# Patient Record
Sex: Male | Born: 1995 | Race: White | Hispanic: Yes | Marital: Married | Smoking: Never smoker
Health system: Southern US, Community
[De-identification: ages and names within clinical notes are randomized; demographics above are authoritative.]

## PROBLEM LIST (undated history)

## (undated) DIAGNOSIS — R41840 Attention and concentration deficit: Secondary | ICD-10-CM

## (undated) DIAGNOSIS — J45909 Unspecified asthma, uncomplicated: Secondary | ICD-10-CM

## (undated) HISTORY — PX: CYSTOSCOPY: SUR368

---
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
## 2013-01-04 ENCOUNTER — Emergency Department (HOSPITAL_COMMUNITY)
Admission: EM | Admit: 2013-01-04 | Discharge: 2013-01-04 | Disposition: A | Discharge status: Home / Self Care | Payer: Medicaid Other | Attending: Emergency Medicine | Admitting: Emergency Medicine

## 2013-01-04 ENCOUNTER — Encounter (HOSPITAL_COMMUNITY): Payer: Self-pay

## 2013-01-04 ENCOUNTER — Emergency Department (HOSPITAL_COMMUNITY): Payer: Medicaid Other

## 2013-01-04 DIAGNOSIS — M546 Pain in thoracic spine: Secondary | ICD-10-CM | POA: Insufficient documentation

## 2013-01-04 DIAGNOSIS — F988 Other specified behavioral and emotional disorders with onset usually occurring in childhood and adolescence: Secondary | ICD-10-CM | POA: Insufficient documentation

## 2013-01-04 LAB — URINALYSIS, ROUTINE W REFLEX MICROSCOPIC
Glucose, UA: NEGATIVE mg/dL
Ketones, ur: NEGATIVE mg/dL
Leukocytes, UA: NEGATIVE
Nitrite: NEGATIVE
Protein, ur: NEGATIVE mg/dL
Urobilinogen, UA: 1 mg/dL (ref 0.0–1.0)

## 2013-01-04 MED ORDER — CYCLOBENZAPRINE HCL 10 MG PO TABS: 10.0000 mg | ORAL_TABLET | Freq: Two times a day (BID) | ORAL | Status: DC | PRN

## 2013-01-04 MED ORDER — NAPROXEN 500 MG PO TABS: 500.0000 mg | ORAL_TABLET | Freq: Two times a day (BID) | ORAL | Status: DC

## 2013-01-04 NOTE — ED Provider Notes (Signed)
Medical screening examination/treatment/procedure(s) were performed by non-physician practitioner and as supervising physician I was immediately available for consultation/collaboration.  Gilda Crease, MD 01/04/13 [phone]

## 2013-01-04 NOTE — ED Provider Notes (Signed)
History     CSN: [id number]  Arrival date & time 01/04/13  1211   First MD Initiated Contact with Patient 01/04/13 1440      Chief Complaint  Patient presents with  . Back Pain    (Consider location/radiation/quality/duration/timing/severity/associated sxs/prior treatment) HPI Carlos Cruz is a 17 y.o. male who presents to ED with complaint of mid back pain. States started about 2 days ago. No injury. No fever, chills, abdominal pain, dysuria, hematuria. Pain radiates up and down the back. Pt has tried ibuprofen, icy hot, and OTC "pain reliever" with no improvement. Pain worsened with movement of the torso and with taking deep breath. Pt admits to having "a lot of" intercourse recently and states his wife is pregnant, and has had to use different positions and wondering if that could cause his pain. NO numbness or weakness of extremities.    Past Medical History  Diagnosis Date  . Attention deficit     Past Surgical History  Procedure Laterality Date  . Cystoscopy      History reviewed. No pertinent family history.  History  Substance Use Topics  . Smoking status: Never Smoker   . Smokeless tobacco: Never Used  . Alcohol Use: Yes     Comment: rarely      Review of Systems  Constitutional: Negative for fever and chills.  HENT: Negative for neck pain and neck stiffness.   Respiratory: Negative.   Cardiovascular: Negative.   Gastrointestinal: Negative for nausea, vomiting and abdominal pain.  Musculoskeletal: Positive for back pain.  Skin: Negative.   Neurological: Negative for weakness and numbness.    Allergies  Ciprofloxacin  Home Medications   Current Outpatient Rx  Name  Route  Sig  Dispense  Refill  . acetaminophen (TYLENOL) 500 MG tablet   Oral   Take 500 mg by mouth every 6 (six) hours as needed for pain.         Marland Kitchen ibuprofen (ADVIL,MOTRIN) 200 MG tablet   Oral   Take 400 mg by mouth every 8 (eight) hours as needed for pain.         .  cyclobenzaprine (FLEXERIL) 10 MG tablet   Oral   Take 1 tablet (10 mg total) by mouth 2 (two) times daily as needed for muscle spasms.   20 tablet   0   . naproxen (NAPROSYN) 500 MG tablet   Oral   Take 1 tablet (500 mg total) by mouth 2 (two) times daily.   30 tablet   0     BP 114/67  Pulse 68  Temp(Src) 98.4 F (36.9 C) (Oral)  Resp 20  SpO2 94%  Physical Exam  Nursing note and vitals reviewed. Constitutional: He is oriented to person, place, and time. He appears well-developed and well-nourished. No distress.  Eyes: Conjunctivae are normal.  Neck: Neck supple.  Cardiovascular: Normal rate, regular rhythm and normal heart sounds.   Pulmonary/Chest: Effort normal and breath sounds normal. No respiratory distress. He has no wheezes. He has no rales.  Musculoskeletal:  Tenderness to the mid left and right back. CVA tenderness. No midline thoracic or lumbar spine tenderness. No pain with bilateral straight leg raise  Neurological: He is alert and oriented to person, place, and time.  Skin: Skin is warm and dry.    ED Course  Procedures (including critical care time)  Results for orders placed during the hospital encounter of 01/04/13  URINALYSIS, ROUTINE W REFLEX MICROSCOPIC      Result Value Range  Color, Urine YELLOW  YELLOW   APPearance CLEAR  CLEAR   Specific Gravity, Urine 1.020  1.005 - 1.030   pH 8.0  5.0 - 8.0   Glucose, UA NEGATIVE  NEGATIVE mg/dL   Hgb urine dipstick NEGATIVE  NEGATIVE   Bilirubin Urine NEGATIVE  NEGATIVE   Ketones, ur NEGATIVE  NEGATIVE mg/dL   Protein, ur NEGATIVE  NEGATIVE mg/dL   Urobilinogen, UA 1.0  0.0 - 1.0 mg/dL   Nitrite NEGATIVE  NEGATIVE   Leukocytes, UA NEGATIVE  NEGATIVE   Dg Chest 2 View  01/04/2013  *RADIOLOGY REPORT*  Clinical Data: Back pain.  Chest pain and shortness of breath.  CHEST - 2 VIEW  Comparison: 11/05/2012  Findings: The heart size and mediastinal contours are within normal limits.  Both lungs are clear.   The visualized skeletal structures are unremarkable.  IMPRESSION: Negative exam.   Original Report Authenticated By: Signa Kell, M.D.       1. Back pain       MDM  Pt with mid upper back pain. No injury. Negative UA. Negative CXR. PERC negative. Will treat with muscle relaxant, NSAIDs, suspect musculoskeletal. Follow up with PCP.   Filed Vitals:   01/04/13 1243  BP: 114/67  Pulse: 68  Temp: 98.4 F (36.9 C)  Resp: [address] A Addaline Peplinski, PA 01/04/13 1531

## 2013-01-04 NOTE — ED Notes (Signed)
Patient reports that his mid back began hurting yesterday. Patient's mother states that she gave the patient Ibuprofen, Icy hot, and OTC "pain reliever", and patient had a hot shower with no relief.

## 2013-02-10 ENCOUNTER — Encounter (HOSPITAL_COMMUNITY): Payer: Self-pay | Admitting: *Deleted

## 2013-02-10 ENCOUNTER — Emergency Department (HOSPITAL_COMMUNITY)
Admission: EM | Admit: 2013-02-10 | Discharge: 2013-02-10 | Disposition: A | Discharge status: Home / Self Care | Payer: Medicaid Other | Attending: Emergency Medicine | Admitting: Emergency Medicine

## 2013-02-10 DIAGNOSIS — Y93G1 Activity, food preparation and clean up: Secondary | ICD-10-CM | POA: Insufficient documentation

## 2013-02-10 DIAGNOSIS — Z8659 Personal history of other mental and behavioral disorders: Secondary | ICD-10-CM | POA: Insufficient documentation

## 2013-02-10 DIAGNOSIS — W261XXA Contact with sword or dagger, initial encounter: Secondary | ICD-10-CM | POA: Insufficient documentation

## 2013-02-10 DIAGNOSIS — Z23 Encounter for immunization: Secondary | ICD-10-CM | POA: Insufficient documentation

## 2013-02-10 DIAGNOSIS — Y929 Unspecified place or not applicable: Secondary | ICD-10-CM | POA: Insufficient documentation

## 2013-02-10 DIAGNOSIS — S61209A Unspecified open wound of unspecified finger without damage to nail, initial encounter: Secondary | ICD-10-CM | POA: Insufficient documentation

## 2013-02-10 DIAGNOSIS — S61219A Laceration without foreign body of unspecified finger without damage to nail, initial encounter: Secondary | ICD-10-CM

## 2013-02-10 DIAGNOSIS — W260XXA Contact with knife, initial encounter: Secondary | ICD-10-CM | POA: Insufficient documentation

## 2013-02-10 MED ORDER — TETANUS-DIPHTH-ACELL PERTUSSIS 5-2.5-18.5 LF-MCG/0.5 IM SUSP
0.5000 mL | Freq: Once | INTRAMUSCULAR | Status: AC
  Administered 2013-02-10: 0.5 mL via INTRAMUSCULAR
  Filled 2013-02-10: qty 0.5

## 2013-02-10 NOTE — ED Notes (Signed)
ZOX:[id number]<AV> Expected date:02/10/13<BR> Expected time:11:26 AM<BR> Means of arrival:Ambulance<BR> Comments:<BR> Cut tip of finger while cooking

## 2013-02-10 NOTE — ED Notes (Signed)
Per EMS - Pt slicing carrots for stew sustained laceration to left long finger w/ knife about 1 hour ago. Pt is holding pressure w/ 8x10. BP - 122/88, HR - 64, Resp - 16.

## 2013-02-10 NOTE — ED Provider Notes (Signed)
History     CSN: [id number]  Arrival date & time 02/10/13  1130   First MD Initiated Contact with Patient 02/10/13 1145      Chief Complaint  Patient presents with  . Extremity Laceration    (Consider location/radiation/quality/duration/timing/severity/associated sxs/prior treatment) HPI Comments: Patient presents with a small shallow finger laceration to the left middle finger. Bleeding is controlled. States that he cut his finger while cutting carrots. This happened approximately one hour ago. He states his pain is moderate. He has not tried anything to alleviate his symptoms. Nothing makes his symptoms better or worse. He does not remember when his last tetanus shot was. He is still able to move his finger without any difficulty.  The history is provided by the patient. No language interpreter was used.    Past Medical History  Diagnosis Date  . Attention deficit     Past Surgical History  Procedure Laterality Date  . Cystoscopy      History reviewed. No pertinent family history.  History  Substance Use Topics  . Smoking status: Never Smoker   . Smokeless tobacco: Never Used  . Alcohol Use: Yes     Comment: rarely      Review of Systems  All other systems reviewed and are negative.    Allergies  Ciprofloxacin  Home Medications  No current outpatient prescriptions on file.  BP 120/73  Pulse 63  Temp(Src) 97.7 F (36.5 C) (Oral)  Resp 16  SpO2 100%  Physical Exam  Nursing note and vitals reviewed. Constitutional: He is oriented to person, place, and time. He appears well-developed and well-nourished.  HENT:  Head: Normocephalic and atraumatic.  Eyes: Conjunctivae and EOM are normal.  Neck: Normal range of motion.  Cardiovascular: Normal rate.   Pulmonary/Chest: Effort normal.  Abdominal: He exhibits no distension.  Musculoskeletal: Normal range of motion.  Neurological: He is alert and oriented to person, place, and time.  Skin: Skin is dry.   Small, shallow, 0.5 cm avulsion of distal left middle finger, bleeding is controlled, no tendon, oh, or ligament involvement. Range of motion and strength 5 out of 5  Psychiatric: He has a normal mood and affect. His behavior is normal. Judgment and thought content normal.    ED Course  Procedures (including critical care time)  Labs Reviewed - No data to display No results found.   1. Finger laceration, initial encounter       MDM  Small shallow 0.5 cm finger avulsion.  No repair required.  Tetanus updated.  Wound cleaned and dressed.        Roxy Horseman, PA-C 02/10/13 1534

## 2013-02-10 NOTE — ED Provider Notes (Signed)
Medical screening examination/treatment/procedure(s) were performed by non-physician practitioner and as supervising physician I was immediately available for consultation/collaboration.   Glynn Octave, MD 02/10/13 [phone]

## 2013-04-30 ENCOUNTER — Emergency Department (HOSPITAL_COMMUNITY)
Admission: EM | Admit: 2013-04-30 | Discharge: 2013-05-01 | Disposition: A | Discharge status: Home / Self Care | Payer: Medicaid Other | Attending: Emergency Medicine | Admitting: Emergency Medicine

## 2013-04-30 ENCOUNTER — Encounter (HOSPITAL_COMMUNITY): Payer: Self-pay | Admitting: *Deleted

## 2013-04-30 DIAGNOSIS — Z8659 Personal history of other mental and behavioral disorders: Secondary | ICD-10-CM | POA: Insufficient documentation

## 2013-04-30 DIAGNOSIS — L02416 Cutaneous abscess of left lower limb: Secondary | ICD-10-CM

## 2013-04-30 DIAGNOSIS — L02419 Cutaneous abscess of limb, unspecified: Secondary | ICD-10-CM | POA: Insufficient documentation

## 2013-04-30 NOTE — ED Notes (Signed)
Pt states that he has had a boil on his leg x 7 days; denies drainge

## 2013-05-01 MED ORDER — LIDOCAINE HCL 2 % IJ SOLN
INTRAMUSCULAR | Status: AC
  Filled 2013-05-01: qty 20

## 2013-05-01 MED ORDER — CLINDAMYCIN HCL 150 MG PO CAPS: 300.0000 mg | ORAL_CAPSULE | Freq: Three times a day (TID) | ORAL | Status: DC

## 2013-05-01 NOTE — ED Provider Notes (Signed)
   History    CSN: [id number] Arrival date & time 04/30/13  2235  First MD Initiated Contact with Patient 04/30/13 2351     Chief Complaint  Patient presents with  . Abscess   (Consider location/radiation/quality/duration/timing/severity/associated sxs/prior Treatment) HPI Carlos Cruz is a 17 y.o. male who presents to ED with complaint of an abscess to the left high. States started a week ago as a pimple. Gradually gotten larger. Now very tender, red. No drainage. Tried compresses with no improvement. Did not take any medications for this.   Past Medical History  Diagnosis Date  . Attention deficit    Past Surgical History  Procedure Laterality Date  . Cystoscopy     No family history on file. History  Substance Use Topics  . Smoking status: Never Smoker   . Smokeless tobacco: Never Used  . Alcohol Use: Yes     Comment: rarely    Review of Systems  Constitutional: Negative for fever and chills.  Respiratory: Negative.   Cardiovascular: Negative.   Skin: Positive for wound.    Allergies  Ciprofloxacin  Home Medications  No current outpatient prescriptions on file. BP 120/52  Pulse 68  Temp(Src) 97.9 F (36.6 C)  Resp 18  Ht 5\' 5"  (1.651 m)  Wt 140 lb 3.2 oz (63.594 kg)  BMI 23.33 kg/m2  SpO2 96% Physical Exam  Vitals reviewed. Constitutional: He appears well-developed and well-nourished. No distress.  Skin: Skin is warm and dry.  3x3cm abscess to the left lateral thigh. Surrounding erythema. Tender to palpation. No drainage.   Psychiatric: He has a normal mood and affect. His behavior is normal.    ED Course  Procedures (including critical care time)  INCISION AND DRAINAGE Performed by: Jaynie Crumble A Consent: Verbal consent obtained. Risks and benefits: risks, benefits and alternatives were discussed Type: abscess  Body area: left thigh  Anesthesia: local infiltration  Incision was made with a scalpel.  Local anesthetic:  lidocaine 2% wo epinephrine  Anesthetic total: 4 ml  Complexity: complex Blunt dissection to break up loculations  Drainage: purulent  Drainage amount: large  Packing material: 1/4 in iodoform gauze  Patient tolerance: Patient tolerated the procedure well with no immediate complications.    1. Abscess of left thigh     MDM  Pt with thigh abscess. No systemic symptoms. No fever, chills, malaise. I&Ded. Will treat with antibiotics, wound care at home, follow up in 2 days for recheck.   Filed Vitals:   04/30/13 2324  BP: 120/52  Pulse: 68  Temp: 97.9 F (36.6 C)  Resp: 18  Height: 5\' 5"  (1.651 m)  Weight: 140 lb 3.2 oz (63.594 kg)  SpO2: 96%     Stephanie Littman A Dylon Correa, PA-C 05/01/13 0050

## 2013-05-01 NOTE — ED Provider Notes (Signed)
Medical screening examination/treatment/procedure(s) were performed by non-physician practitioner and as supervising physician I was immediately available for consultation/collaboration.  Camran Keady M Angelise Petrich, MD 05/01/13 0628 

## 2013-08-29 ENCOUNTER — Emergency Department (HOSPITAL_COMMUNITY)
Admission: EM | Admit: 2013-08-29 | Discharge: 2013-08-29 | Disposition: A | Discharge status: Home / Self Care | Payer: Medicaid Other | Attending: Emergency Medicine | Admitting: Emergency Medicine

## 2013-08-29 ENCOUNTER — Emergency Department (HOSPITAL_COMMUNITY): Payer: Medicaid Other

## 2013-08-29 ENCOUNTER — Encounter (HOSPITAL_COMMUNITY): Payer: Self-pay | Admitting: Emergency Medicine

## 2013-08-29 DIAGNOSIS — Y939 Activity, unspecified: Secondary | ICD-10-CM | POA: Insufficient documentation

## 2013-08-29 DIAGNOSIS — S91332A Puncture wound without foreign body, left foot, initial encounter: Secondary | ICD-10-CM

## 2013-08-29 DIAGNOSIS — Z23 Encounter for immunization: Secondary | ICD-10-CM | POA: Insufficient documentation

## 2013-08-29 DIAGNOSIS — W1789XA Other fall from one level to another, initial encounter: Secondary | ICD-10-CM | POA: Insufficient documentation

## 2013-08-29 DIAGNOSIS — S91309A Unspecified open wound, unspecified foot, initial encounter: Secondary | ICD-10-CM | POA: Insufficient documentation

## 2013-08-29 DIAGNOSIS — Y9289 Other specified places as the place of occurrence of the external cause: Secondary | ICD-10-CM | POA: Insufficient documentation

## 2013-08-29 DIAGNOSIS — W268XXA Contact with other sharp object(s), not elsewhere classified, initial encounter: Secondary | ICD-10-CM | POA: Insufficient documentation

## 2013-08-29 MED ORDER — TETANUS-DIPHTH-ACELL PERTUSSIS 5-2.5-18.5 LF-MCG/0.5 IM SUSP
0.5000 mL | Freq: Once | INTRAMUSCULAR | Status: AC
  Administered 2013-08-29: 0.5 mL via INTRAMUSCULAR
  Filled 2013-08-29: qty 0.5

## 2013-08-29 MED ORDER — HYDROCODONE-ACETAMINOPHEN 5-325 MG PO TABS
1.0000 | ORAL_TABLET | Freq: Once | ORAL | Status: AC
  Administered 2013-08-29: 1 via ORAL
  Filled 2013-08-29: qty 1

## 2013-08-29 MED ORDER — OXYCODONE-ACETAMINOPHEN 5-325 MG PO TABS: 1.0000 | ORAL_TABLET | Freq: Four times a day (QID) | ORAL | Status: DC | PRN

## 2013-08-29 MED ORDER — AMOXICILLIN-POT CLAVULANATE 875-125 MG PO TABS: 1.0000 | ORAL_TABLET | Freq: Two times a day (BID) | ORAL | Status: DC

## 2013-08-29 NOTE — ED Notes (Signed)
Pt states he has a ride home

## 2013-08-29 NOTE — Progress Notes (Signed)
Patient confirms his pcp is at [facility].  System updated.

## 2013-08-29 NOTE — ED Notes (Addendum)
Nail left sticking out of 2 x 4, pt stepped on nail, fell 20 feet to ground with nail and board stuck to foot, small puncture wound noted bottom of left foot

## 2013-08-29 NOTE — ED Provider Notes (Signed)
CSN: [id number]     Arrival date & time 08/29/13  1200 History  This chart was scribed for non-physician practitioner Charlestine Night, PA-C, working with Layla Maw Ward, DO by Dorothey Baseman, ED Scribe. This patient was seen in room WTR5/WTR5 and the patient's care was started at 2:46 PM.    Chief Complaint  Patient presents with  . Puncture Wound   The history is provided by the patient. No language interpreter was used.   HPI Comments: Carlos Cruz is a 17 y.o. male who presents to the Emergency Department complaining of a wound to the bottom of the left foot onset around 5 hours ago when the patient reports that he fell from approximately 20 feet and stepped on an exposed nail. He reports associated pain and numbness to the left foot. He denies back pain. Patient reports that he does not know when his last tetanus vaccination was. He denies any pertinent medical history.   Past Medical History  Diagnosis Date  . Attention deficit    Past Surgical History  Procedure Laterality Date  . Cystoscopy     History reviewed. No pertinent family history. History  Substance Use Topics  . Smoking status: Never Smoker   . Smokeless tobacco: Never Used  . Alcohol Use: Yes     Comment: once a week    Review of Systems  A complete 10 system review of systems was obtained and all systems are negative except as noted in the HPI and PMH.   Allergies  Ciprofloxacin  Home Medications  No current outpatient prescriptions on file.  Triage Vitals: BP 113/72  Pulse 79  Temp(Src) 98.2 F (36.8 C) (Oral)  Resp 18  SpO2 100%  Physical Exam  Nursing note and vitals reviewed. Constitutional: He is oriented to person, place, and time. He appears well-developed and well-nourished. No distress.  HENT:  Head: Normocephalic and atraumatic.  Eyes: Conjunctivae are normal.  Neck: Normal range of motion. Neck supple.  Pulmonary/Chest: Effort normal. No respiratory distress.  Abdominal: He  exhibits no distension.  Musculoskeletal: Normal range of motion.  Neurological: He is alert and oriented to person, place, and time.  Skin: Skin is warm and dry.  Puncture wound to the plantar aspect, just below the great toe, of the left foot that is diffusely tender to palpation.   Psychiatric: He has a normal mood and affect. His behavior is normal.    ED Course  Procedures (including critical care time)  DIAGNOSTIC STUDIES: Oxygen Saturation is 100% on room air, normal by my interpretation.    COORDINATION OF CARE: 2:48 PM- Will order an x-ray of the left foot. Will order a tetanus vaccination and a dose of Vicodin to manage symptoms. Discussed treatment plan with patient at bedside and patient verbalized agreement.   Imaging Review Dg Foot Complete Left  08/29/2013   CLINICAL DATA:  Stepped on a nail.  Left foot pain.  EXAM: LEFT FOOT - COMPLETE 3+ VIEW  COMPARISON:  None.  FINDINGS: There is no evidence of fracture or dislocation. There is no evidence of arthropathy or other focal bone abnormality. Soft tissues are unremarkable.  IMPRESSION: Negative exam. No radiopaque foreign body is identified.   Electronically Signed   By: Drusilla Kanner M.D.   On: 08/29/2013 15:36    I personally performed the services described in this documentation, which was scribed in my presence. The recorded information has been reviewed and is accurate.   Carlyle Dolly, PA-C 09/06/13 [phone]

## 2013-09-06 NOTE — ED Provider Notes (Signed)
Medical screening examination/treatment/procedure(s) were performed by non-physician practitioner and as supervising physician I was immediately available for consultation/collaboration.  EKG Interpretation   None         Vylet Maffia N Yanique Mulvihill, DO 09/06/13 1526 

## 2014-03-31 ENCOUNTER — Emergency Department (HOSPITAL_COMMUNITY)
Admission: EM | Admit: 2014-03-31 | Discharge: 2014-03-31 | Disposition: A | Discharge status: Home / Self Care | Payer: Medicaid Other | Attending: Emergency Medicine | Admitting: Emergency Medicine

## 2014-03-31 ENCOUNTER — Encounter (HOSPITAL_COMMUNITY): Payer: Self-pay | Admitting: Emergency Medicine

## 2014-03-31 DIAGNOSIS — K120 Recurrent oral aphthae: Secondary | ICD-10-CM | POA: Insufficient documentation

## 2014-03-31 DIAGNOSIS — R220 Localized swelling, mass and lump, head: Secondary | ICD-10-CM

## 2014-03-31 DIAGNOSIS — Z8659 Personal history of other mental and behavioral disorders: Secondary | ICD-10-CM | POA: Insufficient documentation

## 2014-03-31 MED ORDER — DIPHENHYDRAMINE HCL 50 MG/ML IJ SOLN
25.0000 mg | Freq: Once | INTRAMUSCULAR | Status: AC
  Administered 2014-03-31: 25 mg via INTRAVENOUS
  Filled 2014-03-31: qty 1

## 2014-03-31 MED ORDER — MAGIC MOUTHWASH
15.0000 mL | Freq: Once | ORAL | Status: AC
  Administered 2014-03-31: 15 mL via ORAL
  Filled 2014-03-31: qty 15

## 2014-03-31 MED ORDER — SODIUM CHLORIDE 0.9 % IV BOLUS (SEPSIS)
1000.0000 mL | Freq: Once | INTRAVENOUS | Status: AC
  Administered 2014-03-31: 1000 mL via INTRAVENOUS

## 2014-03-31 MED ORDER — METHYLPREDNISOLONE SODIUM SUCC 125 MG IJ SOLR
125.0000 mg | Freq: Once | INTRAMUSCULAR | Status: AC
  Administered 2014-03-31: 125 mg via INTRAVENOUS
  Filled 2014-03-31: qty 2

## 2014-03-31 MED ORDER — FAMOTIDINE IN NACL 20-0.9 MG/50ML-% IV SOLN
20.0000 mg | Freq: Once | INTRAVENOUS | Status: AC
  Administered 2014-03-31: 20 mg via INTRAVENOUS
  Filled 2014-03-31: qty 50

## 2014-03-31 MED ORDER — MAGIC MOUTHWASH W/LIDOCAINE: 15.0000 mL | Freq: Four times a day (QID) | ORAL | Status: DC | PRN

## 2014-03-31 NOTE — Discharge Instructions (Signed)
Take the prescribed medication as directed-- swish and spit or swallow. Discontinue using chapstick-- may wish to use plain vaseline instead. Follow-up with your primary care physician. Return to the ED for new or worsening symptoms.

## 2014-03-31 NOTE — ED Notes (Signed)
Pt c/o upper and low lip swelling onset last night, denies airway or tongue involvement, does state he has ulcer on tongue. Pt denies medication or drug usage.

## 2014-03-31 NOTE — ED Provider Notes (Signed)
CSN: [id number]     Arrival date & time 03/31/14  1932 History   First MD Initiated Contact with Patient 03/31/14 1950     Chief Complaint  Patient presents with  . Angioedema     (Consider location/radiation/quality/duration/timing/severity/associated sxs/prior Treatment) The history is provided by the patient and medical records.   This is a [age] y.o. F with PMH significant for ADHD presenting to the ED for lip swelling.  Pt states he began having upper and lower lip swelling and burning yesterday which has progressively worsened.  He states he did use carmex chapstick yesterday and today but has used in the past without issues.  No changes in soaps, detergents, toothpaste, mouthwash, or other personal care products.  No medication changes, not on any ACEI.  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  Pt also notes that he has 2 small ulcers on his lower lip which have been present for the past week-- has had these in the past.  Only has pain when trying to eat or brush his teeth.  No fever or chills.  No chest pain, SOB, labored breathing, or sensation of throat closing.    Past Medical History  Diagnosis Date  . Attention deficit    Past Surgical History  Procedure Laterality Date  . Cystoscopy     No family history on file. History  Substance Use Topics  . Smoking status: Never Smoker   . Smokeless tobacco: Never Used  . Alcohol Use: No    Review of Systems  HENT: Positive for mouth sores.        Lip swelling  All other systems reviewed and are negative.     Allergies  Ciprofloxacin  Home Medications   Prior to Admission medications   Not on File   BP 118/68  Pulse 90  Temp(Src) 98.4 F (36.9 C) (Oral)  Resp 16  Ht 5\' 7"  (1.702 m)  Wt 149 lb (67.586 kg)  BMI 23.33 kg/m2  SpO2 97%  Physical Exam  Nursing note and vitals reviewed. Constitutional: He is oriented to person, place, and time. He appears  well-developed and well-nourished. No distress.  HENT:  Head: Normocephalic and atraumatic.  Mouth/Throat: Uvula is midline, oropharynx is clear and moist and mucous membranes are normal. Oral lesions present.  Upper and lower lips appear mildly swollen and dry/cracked; no active bleeding; 2 small ulcerations of inner lower lip; no appreciable abscess formation; tongue normal in appearance; airway patent; handling secretions appropriately; no difficulty swallowing or speaking  Eyes: Conjunctivae and EOM are normal. Pupils are equal, round, and reactive to light.  Neck: Normal range of motion. Neck supple.  Cardiovascular: Normal rate, regular rhythm and normal heart sounds.   Pulmonary/Chest: Breath sounds normal. No respiratory distress. He has no wheezes.  Musculoskeletal: Normal range of motion.  Neurological: He is alert and oriented to person, place, and time.  Skin: Skin is warm and dry. He is not diaphoretic.  Psychiatric: He has a normal mood and affect.    ED Course  Procedures (including critical care time) Labs Review Labs Reviewed - No data to display  Imaging Review No results found.   EKG Interpretation None      MDM   Final diagnoses:  Lip swelling  Aphthous ulcer   18 y.o. M presenting with upper and lower lip swelling and ulcers of inner lower lip. No changes in personal care products other  than use of Carmex Chapstick.  Patient denies any feelings of his throat closing or difficulty breathing. On exam his lips do appear mildly swollen and are dry and cracked in appearance. He does have 2 small aphthous ulcers of his inner lower lip. No appreciable abscess formation. His airway is patent and he has no difficulty handling secretions.  Does not appear to be an allergic reaction, but will treat with Solu-Medrol, Benadryl, and Pepcid. Dose have Magic mouthwash given here.  After meds lip swelling has improved, still complaining of burning sensations.  Airway remains  patent.  Pt will be discharged home.  Instructed to d/c use of carmex as a precaution, switch to vaseline if needed.  Magic mouthwash w/lidocaine for home.  FU with PCP.  Discussed plan with patient, he/she acknowledged understanding and agreed with plan of care.  Return precautions given for new or worsening symptoms.  Garlon Hatchet, PA-C 03/31/14 2356

## 2014-03-31 NOTE — ED Notes (Signed)
PA-C at bedside 

## 2014-04-01 NOTE — ED Provider Notes (Signed)
Medical screening examination/treatment/procedure(s) were conducted as a shared visit with non-physician practitioner(s) and myself.  I personally evaluated the patient during the encounter.   EKG Interpretation None      I interviewed and examined the patient. Lungs are CTAB. Cardiac exam wnl. Abdomen soft.  Mild swelling of lips. No evidence of tongue or posterior oropharynx swelling. 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 Likely mild contact dermatitis. Will have pt return for any worsening.   Junius Argyle, MD 04/01/14 1158

## 2015-05-14 ENCOUNTER — Emergency Department (HOSPITAL_COMMUNITY): Payer: Medicaid Other

## 2015-05-14 ENCOUNTER — Encounter (HOSPITAL_COMMUNITY): Payer: Self-pay

## 2015-05-14 ENCOUNTER — Emergency Department (HOSPITAL_COMMUNITY)
Admission: EM | Admit: 2015-05-14 | Discharge: 2015-05-15 | Disposition: A | Discharge status: Home / Self Care | Payer: Self-pay | Attending: Emergency Medicine | Admitting: Emergency Medicine

## 2015-05-14 DIAGNOSIS — S0991XA Unspecified injury of ear, initial encounter: Secondary | ICD-10-CM | POA: Insufficient documentation

## 2015-05-14 DIAGNOSIS — Y998 Other external cause status: Secondary | ICD-10-CM | POA: Insufficient documentation

## 2015-05-14 DIAGNOSIS — W11XXXA Fall on and from ladder, initial encounter: Secondary | ICD-10-CM | POA: Insufficient documentation

## 2015-05-14 DIAGNOSIS — J029 Acute pharyngitis, unspecified: Secondary | ICD-10-CM

## 2015-05-14 DIAGNOSIS — Y9289 Other specified places as the place of occurrence of the external cause: Secondary | ICD-10-CM | POA: Insufficient documentation

## 2015-05-14 DIAGNOSIS — H9221 Otorrhagia, right ear: Secondary | ICD-10-CM | POA: Insufficient documentation

## 2015-05-14 DIAGNOSIS — Y9389 Activity, other specified: Secondary | ICD-10-CM | POA: Insufficient documentation

## 2015-05-14 LAB — I-STAT CHEM 8, ED
BUN: 17 mg/dL (ref 6–20)
CALCIUM ION: 1.14 mmol/L (ref 1.12–1.23)
Chloride: 103 mmol/L (ref 101–111)
Creatinine, Ser: 0.8 mg/dL (ref 0.61–1.24)
GLUCOSE: 103 mg/dL — AB (ref 65–99)
HEMATOCRIT: 43 % (ref 39.0–52.0)
HEMOGLOBIN: 14.6 g/dL (ref 13.0–17.0)
Potassium: 3.8 mmol/L (ref 3.5–5.1)
Sodium: 140 mmol/L (ref 135–145)
TCO2: 24 mmol/L (ref 0–100)

## 2015-05-14 LAB — RAPID STREP SCREEN (MED CTR MEBANE ONLY): Streptococcus, Group A Screen (Direct): NEGATIVE

## 2015-05-14 LAB — MONONUCLEOSIS SCREEN: Mono Screen: NEGATIVE

## 2015-05-14 MED ORDER — KETOROLAC TROMETHAMINE 10 MG PO TABS: 10.0000 mg | ORAL_TABLET | Freq: Four times a day (QID) | ORAL | Status: DC | PRN

## 2015-05-14 MED ORDER — HYDROCODONE-ACETAMINOPHEN 5-325 MG PO TABS
1.0000 | ORAL_TABLET | Freq: Once | ORAL | Status: AC
  Administered 2015-05-14: 1 via ORAL
  Filled 2015-05-14: qty 1

## 2015-05-14 MED ORDER — MAGIC MOUTHWASH W/LIDOCAINE: 5.0000 mL | Freq: Four times a day (QID) | ORAL | Status: DC | PRN

## 2015-05-14 MED ORDER — KETOROLAC TROMETHAMINE 10 MG PO TABS
10.0000 mg | ORAL_TABLET | Freq: Once | ORAL | Status: AC
  Administered 2015-05-14: 10 mg via ORAL
  Filled 2015-05-14: qty 1

## 2015-05-14 MED ORDER — HYDROCODONE-ACETAMINOPHEN 7.5-325 MG/15ML PO SOLN
10.0000 mL | Freq: Once | ORAL | Status: AC
  Administered 2015-05-14: 10 mL via ORAL
  Filled 2015-05-14: qty 15

## 2015-05-14 NOTE — ED Notes (Signed)
Pt complains of swollen tonsils and today he was on a ladder and had a syncopal episode and fell about 6 feet and woke up in the work truck

## 2015-05-14 NOTE — ED Provider Notes (Signed)
CSN: [id number]     Arrival date & time 05/14/15  2112 History   This chart was scribed for non-physician practitioner Antony Madura, PA-C working with Purvis Sheffield, MD by Lyndel Safe, ED Scribe. This patient was seen in room WTR9/WTR9 and the patient's care was started at 9:55 PM.   Chief Complaint  Patient presents with  . Fall  . Sore Throat   Patient is a 19 y.o. male presenting with fall and pharyngitis. The history is provided by the patient. No language interpreter was used.  Fall This is a new problem. The current episode started 6 to 12 hours ago. The problem has been resolved. Pertinent negatives include no chest pain and no abdominal pain. Nothing aggravates the symptoms. Nothing relieves the symptoms. He has tried nothing for the symptoms. The treatment provided no relief.  Sore Throat This is a new problem. The current episode started more than 2 days ago. The problem occurs constantly. The problem has been gradually worsening. Pertinent negatives include no chest pain and no abdominal pain. The symptoms are aggravated by swallowing. Nothing relieves the symptoms. He has tried acetaminophen and ASA (saline wash) for the symptoms. The treatment provided no relief.   HPI Comments: Carlos Cruz is a 19 y.o. male,with a PMhx of attention deficit disorder, who presents to the Emergency Department complaining of a gradually worsening, constant, shooting, throbbing, and aching right-sided sore throat and swollen tonsils onset 3 days ago. Pt describes the pain and swelling sensation to resemble his right tonsil popping. He reports the pain is exacerbated with swallowing. Pt states he has tried a saline mouth wash and OTC pain medication with no relief. He states that today at work he felt his tonsils swell when he could not catch his breath resulting in a syncopal episode. The pt reports he fell off a ladder hitting his head on a plastic basketball goal. He additionally notes his right  ear has been bleeding, at least 3 times today. Pt has been working outside in the heat, laying shingles, but states he has been hydrating appropriately. He denies any hearing loss or sensitivities, CP,abdominal pain, nausea, vomiting, or fevers. Additionally denies any sick contacts at work.   Past Medical History  Diagnosis Date  . Attention deficit    Past Surgical History  Procedure Laterality Date  . Cystoscopy     History reviewed. No pertinent family history. History  Substance Use Topics  . Smoking status: Never Smoker   . Smokeless tobacco: Never Used  . Alcohol Use: No    Review of Systems  Constitutional: Negative for fever.  HENT: Positive for ear discharge ( blood) and sore throat. Negative for ear pain and hearing loss.   Cardiovascular: Negative for chest pain.  Gastrointestinal: Negative for nausea, vomiting and abdominal pain.  Neurological: Positive for syncope.  All other systems reviewed and are negative.   Allergies  Ciprofloxacin  Home Medications   Prior to Admission medications   Medication Sig Start Date End Date Taking? Authorizing Provider  Alum & Mag Hydroxide-Simeth (MAGIC MOUTHWASH W/LIDOCAINE) SOLN Take 15 mLs by mouth 4 (four) times daily as needed for mouth pain. 03/31/14   Garlon Hatchet, PA-C   BP 131/88 mmHg  Pulse 96  Temp(Src) 98.2 F (36.8 C) (Oral)  Resp 18  Ht  (1.702 m)  Wt 150 lb (68.04 kg)  BMI 23.49 kg/m2  SpO2 99%  Physical Exam  Constitutional: He is oriented to person, place, and time. He appears  well-developed and well-nourished. No distress.  Nontoxic/nonseptic appearing. On reevaluation, patient is laughing and text and friends, in no distress.  HENT:  Head: Normocephalic and atraumatic.  Right Ear: Tympanic membrane, external ear and ear canal normal.  Left Ear: Tympanic membrane, external ear and ear canal normal.  Nose: Nose normal.  Mouth/Throat: Uvula is midline and mucous membranes are normal. No trismus  in the jaw. No uvula swelling. Oropharyngeal exudate and posterior oropharyngeal erythema present. No posterior oropharyngeal edema.  Mild posterior oropharyngeal erythema. Tonsils mildly enlarged bilaterally with punctate, white exudates. Uvula midline. Patient tolerating secretions without difficulty or drooling. No angioedema.  Eyes: Conjunctivae and EOM are normal. No scleral icterus.  Neck: Normal range of motion. Neck supple.  No nuchal rigidity or meningismus. No stridor.  Cardiovascular: Normal rate, regular rhythm and intact distal pulses.   Pulmonary/Chest: Effort normal and breath sounds normal. No respiratory distress. He has no wheezes. He has no rales.  Respirations even and unlabored. Lungs clear.  Musculoskeletal: Normal range of motion.  Lymphadenopathy:    He has cervical adenopathy (mild right anterior cervical).  Neurological: He is alert and oriented to person, place, and time. No cranial nerve deficit. He exhibits normal muscle tone. Coordination normal.  GCS 15. Speech is goal oriented. No focal neurologic deficits appreciated. Ambulatory with steady gait.  Skin: Skin is warm and dry. No rash noted. He is not diaphoretic. No erythema. No pallor.  Psychiatric: He has a normal mood and affect. His behavior is normal.  Nursing note and vitals reviewed.   ED Course  Procedures  DIAGNOSTIC STUDIES: Oxygen Saturation is 99% on RA, normal by my interpretation.    COORDINATION OF CARE: 10:02 PM Discussed treatment plan which includes to order diagnostic labs with pt. Pt acknowledges and agrees to plan.  11:14 PM Pt requesting X ray.   Labs Review Labs Reviewed  I-STAT CHEM 8, ED - Abnormal; Notable for the following:    Glucose, Bld 103 (*)    All other components within normal limits  RAPID STREP SCREEN (NOT AT [facility])  CULTURE, GROUP A STREP  MONONUCLEOSIS SCREEN    Imaging Review Dg Neck Soft Tissue  05/14/2015   CLINICAL DATA:  Gradually worsening right-sided  sore throat and swollen tonsils. Initial encounter.  EXAM: NECK SOFT TISSUES - 1+ VIEW  COMPARISON:  CT of the cervical spine performed 09/27/2007  FINDINGS: The nasopharynx, oropharynx and hypopharynx are grossly unremarkable. The epiglottis and aryepiglottic folds are within normal limits. The proximal trachea is unremarkable. Prevertebral soft tissues are normal in appearance.  No acute osseous abnormalities are identified. The visualized paranasal sinuses and mastoid air cells are well-aerated. The visualized lung apices are clear.  IMPRESSION: Unremarkable radiographs of the soft tissues of the neck.   Electronically Signed   By: Roanna RaiderJeffery  Chang M.D.   On: 05/14/2015 23:32     EKG Interpretation   Date/Time:  Wednesday May 14 2015 21:41:06 EDT Ventricular Rate:  95 PR Interval:  121 QRS Duration: 89 QT Interval:  330 QTC Calculation: 415 R Axis:   62 Text Interpretation:  Sinus rhythm RSR' in V1 or V2, right VCD or RVH ST  elev, probable normal early repol pattern ED PHYSICIAN INTERPRETATION  AVAILABLE IN [facility] Confirmed by TEST, Record ([id number]) on 05/15/2015  7:48:29 AM      MDM   Final diagnoses:  Viral pharyngitis    Patient afebrile with c/o dysphagia. Strep screen is negative today; diagnosis of viral pharyngitis. Presentation  not concerning for PTA or infxn spread to soft tissue. No trismus or uvula deviation. Patient tolerating secretions without difficulty. No complicating features identified on xray. Patient also with questionable syncopal episode prior to arrival. He reports feeling like he was unable to breathe because of the swelling in his throat causing him to "black out". Patient has a reassuring EKG today; no STEMI or ischemic change. H/H stable. No electrolyte imbalance. He is not orthostatic.  Patient has been texting with friends and laughing over the messages he receives. He is in no distress and he has exhibited ambulation with steady gait while in the ED  today. No clinical signs of dehydration. Do not believe further evaluation is indicated at this time. Specific return precautions discussed including supportive tx for symptoms as an outpatient. Recommended PCP follow up. Patient agreeable to plan with no unaddressed concerns. Patient discharged in good condition; VSS.   Filed Vitals:   05/14/15 2134 05/15/15 0008  BP: 131/88 139/84  Pulse: 96 68  Temp: 98.2 F (36.8 C)   TempSrc: Oral   Resp: 18 18  Height: 5\' 7"  (1.702 m)   Weight: 150 lb (68.04 kg)   SpO2: 99% 99%      Antony Madura, PA-C 05/17/15 0544  Purvis Sheffield, MD 05/17/15 1557

## 2015-05-14 NOTE — Discharge Instructions (Signed)
Pharyngitis °Pharyngitis is redness, pain, and swelling (inflammation) of your pharynx.  °CAUSES  °Pharyngitis is usually caused by infection. Most of the time, these infections are from viruses (viral) and are part of a cold. However, sometimes pharyngitis is caused by bacteria (bacterial). Pharyngitis can also be caused by allergies. Viral pharyngitis may be spread from person to person by coughing, sneezing, and personal items or utensils (cups, forks, spoons, toothbrushes). Bacterial pharyngitis may be spread from person to person by more intimate contact, such as kissing.  °SIGNS AND SYMPTOMS  °Symptoms of pharyngitis include:   °· Sore throat.   °· Tiredness (fatigue).   °· Low-grade fever.   °· Headache. °· Joint pain and muscle aches. °· Skin rashes. °· Swollen lymph nodes. °· Plaque-like film on throat or tonsils (often seen with bacterial pharyngitis). °DIAGNOSIS  °Your health care provider will ask you questions about your illness and your symptoms. Your medical history, along with a physical exam, is often all that is needed to diagnose pharyngitis. Sometimes, a rapid strep test is done. Other lab tests may also be done, depending on the suspected cause.  °TREATMENT  °Viral pharyngitis will usually get better in 3-4 days without the use of medicine. Bacterial pharyngitis is treated with medicines that kill germs (antibiotics).  °HOME CARE INSTRUCTIONS  °· Drink enough water and fluids to keep your urine clear or pale yellow.   °· Only take over-the-counter or prescription medicines as directed by your health care provider:   °¨ If you are prescribed antibiotics, make sure you finish them even if you start to feel better.   °¨ Do not take aspirin.   °· Get lots of rest.   °· Gargle with 8 oz of salt water (½ tsp of salt per 1 qt of water) as often as every 1-2 hours to soothe your throat.   °· Throat lozenges (if you are not at risk for choking) or sprays may be used to soothe your throat. °SEEK MEDICAL  CARE IF:  °· You have large, tender lumps in your neck. °· You have a rash. °· You cough up green, yellow-brown, or bloody spit. °SEEK IMMEDIATE MEDICAL CARE IF:  °· Your neck becomes stiff. °· You drool or are unable to swallow liquids. °· You vomit or are unable to keep medicines or liquids down. °· You have severe pain that does not go away with the use of recommended medicines. °· You have trouble breathing (not caused by a stuffy nose). °MAKE SURE YOU:  °· Understand these instructions. °· Will watch your condition. °· Will get help right away if you are not doing well or get worse. °Document Released: 10/25/2005 Document Revised: 08/15/2013 Document Reviewed: 07/02/2013 °[employer]® Patient Information ©2015 [employer]. This information is not intended to replace advice given to you by your health care provider. Make sure you discuss any questions you have with your health care provider. ° °Salt Water Gargle °This solution will help make your mouth and throat feel better. °HOME CARE INSTRUCTIONS  °· Mix 1 teaspoon of salt in 8 ounces of warm water. °· Gargle with this solution as much or often as you need or as directed. Swish and gargle gently if you have any sores or wounds in your mouth. °· Do not swallow this mixture. °Document Released: 07/29/2004 Document Revised: 01/17/2012 Document Reviewed: 12/20/2008 °Exit[employer]® Patient Information ©2015 [employer]. This information is not intended to replace advice given to you by your health care provider. Make sure you discuss any questions you have with your health care provider. ° °

## 2015-05-15 NOTE — ED Notes (Signed)
Pt called for transport  

## 2015-05-17 LAB — CULTURE, GROUP A STREP: STREP A CULTURE: POSITIVE — AB

## 2015-05-18 ENCOUNTER — Emergency Department (HOSPITAL_COMMUNITY)
Admission: EM | Admit: 2015-05-18 | Discharge: 2015-05-18 | Disposition: A | Discharge status: Home / Self Care | Payer: Medicaid Other | Attending: Emergency Medicine | Admitting: Emergency Medicine

## 2015-05-18 ENCOUNTER — Emergency Department (HOSPITAL_COMMUNITY): Payer: Medicaid Other

## 2015-05-18 ENCOUNTER — Encounter (HOSPITAL_COMMUNITY): Payer: Self-pay | Admitting: Emergency Medicine

## 2015-05-18 DIAGNOSIS — Y999 Unspecified external cause status: Secondary | ICD-10-CM | POA: Insufficient documentation

## 2015-05-18 DIAGNOSIS — W57XXXA Bitten or stung by nonvenomous insect and other nonvenomous arthropods, initial encounter: Secondary | ICD-10-CM | POA: Insufficient documentation

## 2015-05-18 DIAGNOSIS — Y939 Activity, unspecified: Secondary | ICD-10-CM | POA: Insufficient documentation

## 2015-05-18 DIAGNOSIS — Z8659 Personal history of other mental and behavioral disorders: Secondary | ICD-10-CM | POA: Insufficient documentation

## 2015-05-18 DIAGNOSIS — J020 Streptococcal pharyngitis: Secondary | ICD-10-CM

## 2015-05-18 DIAGNOSIS — S90562A Insect bite (nonvenomous), left ankle, initial encounter: Secondary | ICD-10-CM | POA: Insufficient documentation

## 2015-05-18 DIAGNOSIS — Y929 Unspecified place or not applicable: Secondary | ICD-10-CM | POA: Insufficient documentation

## 2015-05-18 MED ORDER — CLINDAMYCIN HCL 300 MG PO CAPS: 300.0000 mg | ORAL_CAPSULE | Freq: Four times a day (QID) | ORAL | Status: DC

## 2015-05-18 MED ORDER — DIPHENHYDRAMINE HCL 25 MG PO CAPS
25.0000 mg | ORAL_CAPSULE | Freq: Once | ORAL | Status: AC
  Administered 2015-05-18: 25 mg via ORAL
  Filled 2015-05-18: qty 1

## 2015-05-18 MED ORDER — PENICILLIN G BENZATHINE 1200000 UNIT/2ML IM SUSP
1.2000 10*6.[IU] | Freq: Once | INTRAMUSCULAR | Status: AC
  Administered 2015-05-18: 1.2 10*6.[IU] via INTRAMUSCULAR
  Filled 2015-05-18: qty 2

## 2015-05-18 NOTE — ED Notes (Signed)
Pt states he was bitten by something on the right ankle yesterday and today it is swollen. Pt also c/o of sore throat, was seen here in the 6th for same given prescriptions but states it is not helping.

## 2015-05-18 NOTE — Discharge Instructions (Signed)

## 2015-05-18 NOTE — ED Provider Notes (Signed)
CSN: [id number]   Arrival date & time 05/18/15 1527  History  This chart was scribed for non-physician practitioner, Teressa LowerVrinda Shamere Campas NP, working with Purvis SheffieldForrest Harrison, MD by Bethel BornBritney McCollum, ED Scribe. This patient was seen in room WTR5/WTR5 and the patient's care was started at 3:39 PM.  Chief Complaint  Patient presents with  . Insect Bite  . Sore Throat    HPI The history is provided by the patient. No language interpreter was used.   Carlos Cruz is a 19 y.o. male who presents to the Emergency Department complaining of an area of increasing swelling at the anterior right ankle with gradual onset yesterday. The pt believes that something may have bitten his ankle because he felt a sudden sharp pain before the onset of swelling. Associated symptoms include redness and itching at the area. Benadryl provided insufficient relief PTA.  He notes that the ankle feels tight but denies pain.  Pt also complains of sore throat that is exacerbated by swallowing. He had a negative strep swab 3 days ago.   Past Medical History  Diagnosis Date  . Attention deficit     Past Surgical History  Procedure Laterality Date  . Cystoscopy      History reviewed. No pertinent family history.  History  Substance Use Topics  . Smoking status: Never Smoker   . Smokeless tobacco: Never Used  . Alcohol Use: No     Review of Systems  HENT: Positive for sore throat.   Skin:       Swelling, redness, and itching at the right ankle  All other systems reviewed and are negative.   Home Medications   Prior to Admission medications   Medication Sig Start Date End Date Taking? Authorizing Provider  acetaminophen (TYLENOL) 500 MG tablet Take 500 mg by mouth every 6 (six) hours as needed for mild pain.    Historical Provider, MD  Alum & Mag Hydroxide-Simeth (MAGIC MOUTHWASH W/LIDOCAINE) SOLN Take 5 mLs by mouth 4 (four) times daily as needed for mouth pain. 05/14/15   Antony MaduraKelly Humes, PA-C  ibuprofen  (ADVIL,MOTRIN) 200 MG tablet Take 200 mg by mouth every 6 (six) hours as needed for mild pain.    Historical Provider, MD  ketorolac (TORADOL) 10 MG tablet Take 1 tablet (10 mg total) by mouth every 6 (six) hours as needed. 05/14/15   Antony MaduraKelly Humes, PA-C    Allergies  Ciprofloxacin  Triage Vitals: BP 127/63 mmHg  Pulse 75  Temp(Src) 98.3 F (36.8 C) (Oral)  Resp 18  SpO2 100%  Physical Exam  Constitutional: He is oriented to person, place, and time. He appears well-developed and well-nourished. No distress.  HENT:  Head: Normocephalic and atraumatic.  Right Ear: External ear normal.  Left Ear: External ear normal.  Mouth/Throat: Posterior oropharyngeal edema and posterior oropharyngeal erythema present.  Eyes: Conjunctivae and EOM are normal.  Neck: Normal range of motion. Neck supple. No tracheal deviation present.  Cardiovascular: Normal rate.   Pulmonary/Chest: Effort normal and breath sounds normal. No respiratory distress.  Abdominal: Soft. There is no tenderness.  Musculoskeletal:  Swelling noted to the left ankle mild redness to lateral ankle. Full rom. Pulses intact  Neurological: He is alert and oriented to person, place, and time.  Skin: Skin is warm and dry.  Psychiatric: He has a normal mood and affect. His behavior is normal.  Nursing note and vitals reviewed.   ED Course  Procedures   DIAGNOSTIC STUDIES: Oxygen Saturation is 100% on RA, normal by  my interpretation.    COORDINATION OF CARE: 3:44 PM Discussed treatment plan which includes right ankle XR and Bicillin LA with pt at bedside and pt agreed to plan.  Labs Review- Labs Reviewed - No data to display  Imaging Review No results found.  EKG Interpretation None      MDM   Final diagnoses:  None   Pts strep culture grew positive. Will treat with clindamycin for early cellulitis to the ankle. Considered rheumatic disease but doubt.  I personally performed the services described in this  documentation, which was scribed in my presence. The recorded information has been reviewed and is accurate.      Teressa Lower, NP 05/18/15 1617  Eber Hong, MD 05/19/15 1451

## 2015-05-20 ENCOUNTER — Telehealth: Payer: Self-pay | Admitting: *Deleted

## 2015-05-20 NOTE — ED Notes (Signed)
(+)  step, treated with Clindamycin, OK per Milana NaJ. Frans, Pharm

## 2016-05-19 ENCOUNTER — Encounter (HOSPITAL_COMMUNITY): Payer: Self-pay

## 2016-05-19 ENCOUNTER — Emergency Department (HOSPITAL_COMMUNITY)
Admission: EM | Admit: 2016-05-19 | Discharge: 2016-05-19 | Discharge status: Against Medical Advice | Payer: Medicaid Other | Attending: Emergency Medicine | Admitting: Emergency Medicine

## 2016-05-19 DIAGNOSIS — R109 Unspecified abdominal pain: Secondary | ICD-10-CM | POA: Insufficient documentation

## 2016-05-19 DIAGNOSIS — Z5321 Procedure and treatment not carried out due to patient leaving prior to being seen by health care provider: Secondary | ICD-10-CM | POA: Diagnosis not present

## 2016-05-19 NOTE — ED Notes (Signed)
Pt complains of and earache and abdominal pain for a few days

## 2016-07-11 ENCOUNTER — Emergency Department (HOSPITAL_COMMUNITY): Payer: Self-pay

## 2016-07-11 ENCOUNTER — Encounter (HOSPITAL_COMMUNITY): Payer: Self-pay | Admitting: Emergency Medicine

## 2016-07-11 ENCOUNTER — Emergency Department (HOSPITAL_COMMUNITY)
Admission: EM | Admit: 2016-07-11 | Discharge: 2016-07-11 | Disposition: A | Discharge status: Home / Self Care | Payer: Self-pay | Attending: Emergency Medicine | Admitting: Emergency Medicine

## 2016-07-11 DIAGNOSIS — K529 Noninfective gastroenteritis and colitis, unspecified: Secondary | ICD-10-CM | POA: Insufficient documentation

## 2016-07-11 LAB — COMPREHENSIVE METABOLIC PANEL
ALT: 35 U/L (ref 17–63)
ANION GAP: 8 (ref 5–15)
AST: 26 U/L (ref 15–41)
Albumin: 4.4 g/dL (ref 3.5–5.0)
Alkaline Phosphatase: 72 U/L (ref 38–126)
BILIRUBIN TOTAL: 0.4 mg/dL (ref 0.3–1.2)
BUN: 13 mg/dL (ref 6–20)
CO2: 26 mmol/L (ref 22–32)
Calcium: 9.1 mg/dL (ref 8.9–10.3)
Chloride: 103 mmol/L (ref 101–111)
Creatinine, Ser: 0.67 mg/dL (ref 0.61–1.24)
GFR calc Af Amer: >60 mL/min (ref >60)
GFR calc non Af Amer: >60 mL/min (ref >60)
Glucose, Bld: 102 mg/dL — ABNORMAL HIGH (ref 65–99)
Potassium: 3.4 mmol/L — ABNORMAL LOW (ref 3.5–5.1)
SODIUM: 137 mmol/L (ref 135–145)
TOTAL PROTEIN: 8 g/dL (ref 6.5–8.1)

## 2016-07-11 LAB — CBC WITH DIFFERENTIAL/PLATELET
BASOS PCT: 0 %
Basophils Absolute: 0 10*3/uL (ref 0.0–0.1)
EOS ABS: 0.4 10*3/uL (ref 0.0–0.7)
EOS PCT: 4 %
HCT: 36.8 % — ABNORMAL LOW (ref 39.0–52.0)
Hemoglobin: 13.1 g/dL (ref 13.0–17.0)
Lymphocytes Relative: 12 %
Lymphs Abs: 1.1 10*3/uL (ref 0.7–4.0)
MCH: 30.2 pg (ref 26.0–34.0)
MCHC: 35.6 g/dL (ref 30.0–36.0)
MCV: 84.8 fL (ref 78.0–100.0)
MONO ABS: 1.4 10*3/uL — AB (ref 0.1–1.0)
MONOS PCT: 15 %
Neutro Abs: 6.7 10*3/uL (ref 1.7–7.7)
Neutrophils Relative %: 69 %
PLATELETS: 278 10*3/uL (ref 150–400)
RBC: 4.34 MIL/uL (ref 4.22–5.81)
RDW: 11.9 % (ref 11.5–15.5)
WBC: 9.6 10*3/uL (ref 4.0–10.5)

## 2016-07-11 LAB — POC OCCULT BLOOD, ED: Fecal Occult Bld: NEGATIVE

## 2016-07-11 LAB — LIPASE, BLOOD: Lipase: 22 U/L (ref 11–51)

## 2016-07-11 MED ORDER — DICYCLOMINE HCL 20 MG PO TABS: 20.0000 mg | ORAL_TABLET | Freq: Two times a day (BID) | ORAL | 0 refills | Status: DC

## 2016-07-11 MED ORDER — IOPAMIDOL (ISOVUE-300) INJECTION 61%
15.0000 mL | Freq: Once | INTRAVENOUS | Status: AC | PRN
  Administered 2016-07-11: 15 mL via ORAL

## 2016-07-11 MED ORDER — LACTINEX PO PACK: PACK | ORAL | 0 refills | Status: DC

## 2016-07-11 MED ORDER — KETOROLAC TROMETHAMINE 30 MG/ML IJ SOLN
30.0000 mg | Freq: Once | INTRAMUSCULAR | Status: AC
  Administered 2016-07-11: 30 mg via INTRAVENOUS
  Filled 2016-07-11: qty 1

## 2016-07-11 MED ORDER — IOPAMIDOL (ISOVUE-300) INJECTION 61%
100.0000 mL | Freq: Once | INTRAVENOUS | Status: AC | PRN
Start: 2016-07-11 — End: 2016-07-11
  Administered 2016-07-11: 100 mL via INTRAVENOUS

## 2016-07-11 MED ORDER — SODIUM CHLORIDE 0.9 % IV BOLUS (SEPSIS)
1000.0000 mL | Freq: Once | INTRAVENOUS | Status: AC
Start: 2016-07-11 — End: 2016-07-11
  Administered 2016-07-11: 1000 mL via INTRAVENOUS

## 2016-07-11 NOTE — ED Provider Notes (Signed)
[facility]-EMERGENCY DEPT Provider Note   CSN: [id number] Arrival date & time: 07/11/16  0043  By signing my name below, I, Octavia Heir, attest that this documentation has been prepared under the direction and in the presence of [employer], PA-C.  Electronically Signed: Octavia Heir, ED Scribe. 07/11/16. 1:53 AM.    History   Chief Complaint Chief Complaint  Patient presents with  . Abdominal Pain    The history is provided by the patient. No language interpreter was used.   HPI Comments: Carlos Cruz is a 20 y.o. male who presents to the Emergency Department complaining of sudden onset, constant, mid abdominal pain onset 3 days ago. He reports associated diarrhea with bloody mucus in his bowel movements. Pt says that he ate homemade chicken noodle soup ~ 4 days ago where he swallowed a small bone and notes having pain since. He reports worsening pain with ambulation. There are no alleviating factors of his pain. He denies any fever, nausea, vomiting, dysuria, hematuria, or hx of abdominal surgeries. No recent travel.   Past Medical History:  Diagnosis Date  . Attention deficit     There are no active problems to display for this patient.   Past Surgical History:  Procedure Laterality Date  . CYSTOSCOPY       Home Medications    Prior to Admission medications   Medication Sig Start Date End Date Taking? Authorizing Provider  dicyclomine (BENTYL) 20 MG tablet Take 1 tablet (20 mg total) by mouth 2 (two) times daily. 07/11/16   Antony Madura, PA-C  Lactobacillus (LACTINEX) PACK Mix 1/2 packet with soft food and take twice a day for 5 days. 07/11/16   Antony Madura, PA-C    Family History No family history on file.  Social History Social History  Substance Use Topics  . Smoking status: Never Smoker  . Smokeless tobacco: Never Used  . Alcohol use No     Allergies   Ciprofloxacin   Review of Systems Review of Systems A complete 10 system review of systems was  obtained and all systems are negative except as noted in the HPI and PMH.    Physical Exam Updated Vital Signs BP 99/64 (BP Location: Left Arm)   Pulse 75   Temp 97.8 F (36.6 C) (Oral)   Resp 16   Ht 5\' 7"  (1.702 m)   Wt 67.1 kg   SpO2 100%   BMI 23.18 kg/m   Physical Exam  Constitutional: He is oriented to person, place, and time. He appears well-developed and well-nourished. No distress.  Nontoxic and in no distress.  HENT:  Head: Normocephalic and atraumatic.  Eyes: Conjunctivae and EOM are normal. No scleral icterus.  Neck: Normal range of motion.  Cardiovascular: Normal rate, regular rhythm and intact distal pulses.   Pulmonary/Chest: Effort normal. No respiratory distress. He has no wheezes. He has no rales.  Lungs clear to auscultation bilaterally. Chest expansion symmetric.  Abdominal: Soft. He exhibits no distension and no mass. There is generalized tenderness. There is no guarding.  Soft abdomen with generalized tenderness to palpation. No focal tenderness. No masses. No peritoneal signs.  Musculoskeletal: Normal range of motion.  Neurological: He is alert and oriented to person, place, and time.  GCS 15. Patient ambulatory with steady gait.  Skin: Skin is warm and dry. No rash noted. He is not diaphoretic. No erythema. No pallor.  Psychiatric: He has a normal mood and affect. His behavior is normal.  Nursing note and vitals reviewed.  ED Treatments / Results  DIAGNOSTIC STUDIES: Oxygen Saturation is 100% on RA, normal by my interpretation.  COORDINATION OF CARE:  1:53 AM Discussed treatment plan with pt at bedside and pt agreed to plan.  Labs (all labs ordered are listed, but only abnormal results are displayed) Labs Reviewed  CBC WITH DIFFERENTIAL/PLATELET - Abnormal; Notable for the following:       Result Value   HCT 36.8 (*)    Monocytes Absolute 1.4 (*)    All other components within normal limits  COMPREHENSIVE METABOLIC PANEL - Abnormal;  Notable for the following:    Potassium 3.4 (*)    Glucose, Bld 102 (*)    All other components within normal limits  GASTROINTESTINAL PANEL BY PCR, STOOL (REPLACES STOOL CULTURE)  LIPASE, BLOOD  POC OCCULT BLOOD, ED    EKG  EKG Interpretation None       Radiology Ct Abdomen Pelvis W Contrast  Result Date: 07/11/2016 CLINICAL DATA:  Sudden onset mid abdominal pain 3 days ago. Diarrhea with bloody bowel movements. Swallowed a small bone 4 days ago. Pain since then. EXAM: CT ABDOMEN AND PELVIS WITH CONTRAST TECHNIQUE: Multidetector CT imaging of the abdomen and pelvis was performed using the standard protocol following bolus administration of intravenous contrast. CONTRAST:  15mL ISOVUE-300 IOPAMIDOL (ISOVUE-300) INJECTION 61%, 100mL ISOVUE-300 IOPAMIDOL (ISOVUE-300) INJECTION 61% COMPARISON:  None. FINDINGS: The lung bases are clear. The liver, spleen, gallbladder, pancreas, adrenal glands, kidneys, abdominal aorta, inferior vena cava, and retroperitoneal lymph nodes are unremarkable. Stomach, small bowel, and colon are not abnormally distended. Scattered stool in the colon. No free air or free fluid in the abdomen. Pelvis: Appendix is normal. Few scattered lymph nodes in the right lower quadrant are moderately prominent at 8 mm short axis dimension. These are nonspecific but likely reactive. Bladder wall is not thickened. Prostate gland is not enlarged. No free or loculated pelvic fluid collections. No pelvic mass or lymphadenopathy. No evidence of diverticulitis. No destructive bone lesions. IMPRESSION: Mild prominence of right lower quadrant lymph nodes may represent reactive nodes. No evidence of bowel obstruction or inflammation. No free air. Electronically Signed   By: Burman NievesWilliam  Stevens M.D.   On: 07/11/2016 03:41    Procedures Procedures (including critical care time)  Medications Ordered in ED Medications  ketorolac (TORADOL) 30 MG/ML injection 30 mg (30 mg Intravenous Given 07/11/16  0240)  sodium chloride 0.9 % bolus 1,000 mL (0 mLs Intravenous Stopped 07/11/16 0450)  iopamidol (ISOVUE-300) 61 % injection 15 mL (15 mLs Oral Contrast Given 07/11/16 0223)  iopamidol (ISOVUE-300) 61 % injection 100 mL (100 mLs Intravenous Contrast Given 07/11/16 0319)     Initial Impression / Assessment and Plan / ED Course  I have reviewed the triage vital signs and the nursing notes.  Pertinent labs & imaging results that were available during my care of the patient were reviewed by me and considered in my medical decision making (see chart for details).  Clinical Course    Patient with symptoms consistent with viral gastroenteritis. Vitals are stable, no fever. No signs of dehydration. Lungs are clear. No focal abdominal pain. Laboratory work up reassuring. No fever or leukocytosis. CT negative for emergent abdominal or pelvic process. There is some mesenteric lymphadenopathy; suspect viral etiology. Hemoccult negative. Supportive therapy indicated with return if symptoms worsen. Patient discharged in stable condition with no unaddressed concerns.   Final Clinical Impressions(s) / ED Diagnoses   Final diagnoses:  Gastroenteritis    I personally performed the  services described in this documentation, which was scribed in my presence. The recorded information has been reviewed and is accurate.    New Prescriptions New Prescriptions   DICYCLOMINE (BENTYL) 20 MG TABLET    Take 1 tablet (20 mg total) by mouth 2 (two) times daily.   LACTOBACILLUS (LACTINEX) PACK    Mix 1/2 packet with soft food and take twice a day for 5 days.     Antony Madura, PA-C 07/11/16 0510    Cy Blamer, MD 07/11/16 [phone]

## 2016-07-11 NOTE — ED Triage Notes (Signed)
Pt states 3 days ago while eating chicken soup he swallowed bone, began having mid abd pain with bright red BM's tonight. Denies n/v.

## 2016-08-13 ENCOUNTER — Encounter (HOSPITAL_COMMUNITY): Payer: Self-pay | Admitting: Emergency Medicine

## 2016-08-13 ENCOUNTER — Emergency Department (HOSPITAL_COMMUNITY): Payer: No Typology Code available for payment source

## 2016-08-13 ENCOUNTER — Emergency Department (HOSPITAL_COMMUNITY)
Admission: EM | Admit: 2016-08-13 | Discharge: 2016-08-13 | Disposition: A | Discharge status: Home / Self Care | Payer: No Typology Code available for payment source | Attending: Emergency Medicine | Admitting: Emergency Medicine

## 2016-08-13 DIAGNOSIS — Y92410 Unspecified street and highway as the place of occurrence of the external cause: Secondary | ICD-10-CM | POA: Diagnosis not present

## 2016-08-13 DIAGNOSIS — Y999 Unspecified external cause status: Secondary | ICD-10-CM | POA: Insufficient documentation

## 2016-08-13 DIAGNOSIS — Y939 Activity, unspecified: Secondary | ICD-10-CM | POA: Diagnosis not present

## 2016-08-13 DIAGNOSIS — S59901A Unspecified injury of right elbow, initial encounter: Secondary | ICD-10-CM | POA: Diagnosis present

## 2016-08-13 DIAGNOSIS — S0990XA Unspecified injury of head, initial encounter: Secondary | ICD-10-CM | POA: Diagnosis not present

## 2016-08-13 DIAGNOSIS — S5001XA Contusion of right elbow, initial encounter: Secondary | ICD-10-CM | POA: Diagnosis not present

## 2016-08-13 NOTE — ED Notes (Signed)
Bed: [facility] Expected date:  Expected time:  Means of arrival:  Comments: 
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
## 2016-08-13 NOTE — ED Notes (Signed)
Pt is up and walking around with his family.

## 2016-08-13 NOTE — Discharge Instructions (Signed)
Ice to painful areas  Motrin or tylenol as needed.
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
## 2017-07-15 ENCOUNTER — Emergency Department (HOSPITAL_COMMUNITY)
Admission: EM | Admit: 2017-07-15 | Discharge: 2017-07-15 | Discharge status: Absent without leave | Payer: No Typology Code available for payment source

## 2017-10-12 ENCOUNTER — Other Ambulatory Visit: Payer: Self-pay

## 2017-10-12 ENCOUNTER — Emergency Department (HOSPITAL_COMMUNITY)
Admission: EM | Admit: 2017-10-12 | Discharge: 2017-10-12 | Disposition: A | Discharge status: Home / Self Care | Payer: Self-pay | Attending: Emergency Medicine | Admitting: Emergency Medicine

## 2017-10-12 ENCOUNTER — Emergency Department (HOSPITAL_COMMUNITY): Payer: Self-pay

## 2017-10-12 ENCOUNTER — Encounter (HOSPITAL_COMMUNITY): Payer: Self-pay

## 2017-10-12 DIAGNOSIS — Z23 Encounter for immunization: Secondary | ICD-10-CM | POA: Insufficient documentation

## 2017-10-12 DIAGNOSIS — Y929 Unspecified place or not applicable: Secondary | ICD-10-CM | POA: Insufficient documentation

## 2017-10-12 DIAGNOSIS — W01118A Fall on same level from slipping, tripping and stumbling with subsequent striking against other sharp object, initial encounter: Secondary | ICD-10-CM | POA: Insufficient documentation

## 2017-10-12 DIAGNOSIS — Y939 Activity, unspecified: Secondary | ICD-10-CM | POA: Insufficient documentation

## 2017-10-12 DIAGNOSIS — Y999 Unspecified external cause status: Secondary | ICD-10-CM | POA: Insufficient documentation

## 2017-10-12 DIAGNOSIS — S81811A Laceration without foreign body, right lower leg, initial encounter: Secondary | ICD-10-CM | POA: Insufficient documentation

## 2017-10-12 MED ORDER — TETANUS-DIPHTH-ACELL PERTUSSIS 5-2.5-18.5 LF-MCG/0.5 IM SUSP
0.5000 mL | Freq: Once | INTRAMUSCULAR | Status: AC
  Administered 2017-10-12: 0.5 mL via INTRAMUSCULAR
  Filled 2017-10-12: qty 0.5

## 2017-10-12 NOTE — ED Triage Notes (Signed)
Pt was at work and was stuck with a rusty nail. He states some of the nail broke off, but he doesn't think that all of it came out. He also states he needs a tetanus shot. A&Ox4. Bleeding at the site noted, but controlled.

## 2017-10-12 NOTE — ED Notes (Signed)
Bed: [id number] Expected date:  Expected time:  Means of arrival:  Comments: Bed needs switching
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
## 2017-12-06 ENCOUNTER — Emergency Department (HOSPITAL_COMMUNITY): Payer: Self-pay

## 2017-12-06 ENCOUNTER — Emergency Department (HOSPITAL_COMMUNITY)
Admission: EM | Admit: 2017-12-06 | Discharge: 2017-12-06 | Disposition: A | Discharge status: Home / Self Care | Payer: Self-pay | Attending: Emergency Medicine | Admitting: Emergency Medicine

## 2017-12-06 ENCOUNTER — Other Ambulatory Visit: Payer: Self-pay

## 2017-12-06 ENCOUNTER — Encounter (HOSPITAL_COMMUNITY): Payer: Self-pay

## 2017-12-06 DIAGNOSIS — R059 Cough, unspecified: Secondary | ICD-10-CM

## 2017-12-06 DIAGNOSIS — R0789 Other chest pain: Secondary | ICD-10-CM | POA: Insufficient documentation

## 2017-12-06 DIAGNOSIS — R05 Cough: Secondary | ICD-10-CM | POA: Insufficient documentation

## 2017-12-06 LAB — CBC
HCT: 40.7 % (ref 39.0–52.0)
HEMOGLOBIN: 14.2 g/dL (ref 13.0–17.0)
MCH: 31 pg (ref 26.0–34.0)
MCHC: 34.9 g/dL (ref 30.0–36.0)
MCV: 88.9 fL (ref 78.0–100.0)
Platelets: 299 10*3/uL (ref 150–400)
RBC: 4.58 MIL/uL (ref 4.22–5.81)
RDW: 12.2 % (ref 11.5–15.5)
WBC: 6.2 10*3/uL (ref 4.0–10.5)

## 2017-12-06 LAB — BASIC METABOLIC PANEL
ANION GAP: 7 (ref 5–15)
BUN: 16 mg/dL (ref 6–20)
CHLORIDE: 109 mmol/L (ref 101–111)
CO2: 23 mmol/L (ref 22–32)
CREATININE: 0.63 mg/dL (ref 0.61–1.24)
Calcium: 9 mg/dL (ref 8.9–10.3)
GFR calc Af Amer: >60 mL/min (ref >60)
GFR calc non Af Amer: >60 mL/min (ref >60)
Glucose, Bld: 142 mg/dL — ABNORMAL HIGH (ref 65–99)
POTASSIUM: 4.5 mmol/L (ref 3.5–5.1)
SODIUM: 139 mmol/L (ref 135–145)

## 2017-12-06 LAB — I-STAT TROPONIN, ED
TROPONIN I, POC: 0 ng/mL (ref 0.00–0.08)
TROPONIN I, POC: 0 ng/mL (ref 0.00–0.08)

## 2017-12-06 MED ORDER — IBUPROFEN 600 MG PO TABS: 600.0000 mg | ORAL_TABLET | Freq: Three times a day (TID) | ORAL | 0 refills | Status: AC

## 2017-12-06 NOTE — Discharge Instructions (Signed)
Your labs, xray, and EKG are all reassuring, your chest pain could be a variety of things including rib cage pain from coughing, gas pain, muscle pain, and other nonemergent issues. Take ibuprofen as directed for the next 2 weeks, and use additional tylenol for additional pain relief. Always take ibuprofen on a full stomach. If ibuprofen upsets your stomach, then take it with zantac or pepcid to help minimize this issue. Stay well hydrated. You may consider using heat to the areas of pain, no more than 20 minutes every hour. Avoid strenuous activity or exercise until you've been cleared by a primary care doctor. Use over the counter cough/cold medications to help with your cough, such as mucinex. Follow up with the [facility] in 1 week for recheck of symptoms and to establish medical care. Return to the ER for changes or worsening symptoms.  SEEK IMMEDIATE MEDICAL ATTENTION IF: You develop a fever.  Your chest pains become severe or intolerable.  You develop new, unexplained symptoms (problems).  You develop shortness of breath, nausea, vomiting, sweating or feel light headed.  You develop a new cough or you cough up blood. You develop new leg swelling

## 2017-12-06 NOTE — ED Notes (Signed)
When called this time for blood draw, pt was present in the lobby.  Pt sts he [place] [employer] [place] his [place] to eat.  Encouraged pt not to leaving the waiting area if wanting to be treated, because if taken out of system for not response he would have to begin process over, also writer stated to pt that the RN ask him not to eat or drink anything, but sts he had soup.

## 2017-12-06 NOTE — ED Notes (Signed)
Pt called x2 for blood draw with no response. RN notifed.

## 2017-12-06 NOTE — ED Provider Notes (Signed)
[facility]-EMERGENCY DEPT Provider Note   CSN: [id number] Arrival date & time: 12/06/17  1633     History   Chief Complaint Chief Complaint  Patient presents with  . Cough  . Chest Pain    HPI Carlos Cruz is a 22 y.o. male with a PMHx of ADD, who presents to the ED with complaints of intermittent chest pain x 1 week.  Patient states that last month he had a productive cough and a "cold" which eventually self-resolved.  About 1 week ago he started having intermittent chest pain which he describes as 4/10 sharp intermittent nonradiating left upper chest pain which worsens with movement or coughing, has been unchanged with exertion or inspiration, and is significantly improved with massaging the area as well as taking an over-the-counter pain reliever (unknown what type).  He states that currently his CP is resolved.  He reports that today his cough returned and has been a dry nonproductive cough.  He reports associated wheezing occasionally.  He denies ever having any hemoptysis with his coughs.  He is a non-smoker.  Reports having a family history of an MI in his MGF, and his mom has an aneurysm on her heart.    He denies diaphoresis, lightheadedness, fevers, chills, hemoptysis, SOB, LE swelling, recent travel/surgery/immobilization, personal/family hx of DVT/PE, abd pain, N/V/D/C, hematuria, dysuria, myalgias, arthralgias, claudication, orthopnea, numbness, tingling, focal weakness, or any other complaints at this time.   The history is provided by the patient and medical records. No language interpreter was used.  Cough  Associated symptoms include chest pain and wheezing. Pertinent negatives include no chills, no myalgias and no shortness of breath.  Chest Pain   This is a new problem. The current episode started more than 2 days ago. Episode frequency: intermittently. The problem has been resolved (currently resolved, but occurs intermittently). The pain is  associated with coughing and movement. The pain is present in the lateral region. The pain is at a severity of 4/10. The pain is mild. The quality of the pain is described as brief and sharp. The pain does not radiate. Duration of episode(s) is 1 week. Exacerbated by: movement or coughing. Associated symptoms include cough. Pertinent negatives include no abdominal pain, no claudication, no diaphoresis, no fever, no hemoptysis, no lower extremity edema, no nausea, no numbness, no orthopnea, no shortness of breath, no sputum production, no vomiting and no weakness. Treatments tried: massaging area and OTC pain reliever. The treatment provided significant relief. Risk factors include male gender.  Pertinent negatives for past medical history include no diabetes, no DVT, no hyperlipidemia, no hypertension and no PE.  His family medical history is significant for heart disease.  Pertinent negatives for family medical history include: no PE.    Past Medical History:  Diagnosis Date  . Attention deficit     There are no active problems to display for this patient.   Past Surgical History:  Procedure Laterality Date  . CYSTOSCOPY         Home Medications    Prior to Admission medications   Medication Sig Start Date End Date Taking? Authorizing Provider  dicyclomine (BENTYL) 20 MG tablet Take 1 tablet (20 mg total) by mouth 2 (two) times daily. Patient not taking: Reported on 08/13/2016 07/11/16   Antony Madura, PA-C  Lactobacillus Delia Heady) PACK Mix 1/2 packet with soft food and take twice a day for 5 days. Patient not taking: Reported on 08/13/2016 07/11/16   Antony Madura, PA-C  Family History History reviewed. No pertinent family history.  Social History Social History   Tobacco Use  . Smoking status: Never Smoker  . Smokeless tobacco: Never Used  Substance Use Topics  . Alcohol use: Yes    Comment: occasionally  . Drug use: No     Allergies   Ciprofloxacin   Review of  Systems Review of Systems  Constitutional: Negative for chills, diaphoresis and fever.  Respiratory: Positive for cough and wheezing. Negative for hemoptysis, sputum production and shortness of breath.        No hemoptysis  Cardiovascular: Positive for chest pain. Negative for orthopnea, claudication and leg swelling.  Gastrointestinal: Negative for abdominal pain, constipation, diarrhea, nausea and vomiting.  Genitourinary: Negative for dysuria and hematuria.  Musculoskeletal: Negative for arthralgias and myalgias.  Skin: Negative for color change.  Allergic/Immunologic: Negative for immunocompromised state.  Neurological: Negative for weakness, light-headedness and numbness.  Psychiatric/Behavioral: Negative for confusion.   All other systems reviewed and are negative for acute change except as noted in the HPI.    Physical Exam Updated Vital Signs BP 125/82 (BP Location: Right Arm)   Pulse 81   Temp 97.6 F (36.4 C) (Oral)   Resp 16   Ht 5\' 2"  (1.575 m)   Wt 67.7 kg (149 lb 4.8 oz)   SpO2 99%   BMI 27.31 kg/m   Physical Exam  Constitutional: He is oriented to person, place, and time. Vital signs are normal. He appears well-developed and well-nourished.  Non-toxic appearance. No distress.  Afebrile, nontoxic, NAD  HENT:  Head: Normocephalic and atraumatic.  Mouth/Throat: Oropharynx is clear and moist and mucous membranes are normal.  Eyes: Conjunctivae and EOM are normal. Right eye exhibits no discharge. Left eye exhibits no discharge.  Neck: Normal range of motion. Neck supple.  Cardiovascular: Normal rate, regular rhythm, normal heart sounds and intact distal pulses. Exam reveals no gallop and no friction rub.  No murmur heard. RRR, nl s1/s2, no m/r/g, distal pulses intact, no pedal edema   Pulmonary/Chest: Effort normal and breath sounds normal. No respiratory distress. He has no decreased breath sounds. He has no wheezes. He has no rhonchi. He has no rales. He exhibits  tenderness. He exhibits no crepitus, no deformity and no retraction.  CTAB in all lung fields, no w/r/r, no hypoxia or increased WOB, speaking in full sentences, SpO2 99% on RA Chest wall with mild TTP to left upper chest between upper ribs, without crepitus, deformities, or retractions     Abdominal: Soft. Normal appearance and bowel sounds are normal. He exhibits no distension. There is no tenderness. There is no rigidity, no rebound, no guarding, no CVA tenderness, no tenderness at McBurney's point and negative Murphy's sign.  Musculoskeletal: Normal range of motion.  MAE x4 Strength and sensation grossly intact in all extremities Distal pulses intact Gait steady No pedal edema, neg homan's bilaterally   Neurological: He is alert and oriented to person, place, and time. He has normal strength. No sensory deficit.  Skin: Skin is warm, dry and intact. No rash noted.  Psychiatric: He has a normal mood and affect.  Nursing note and vitals reviewed.    ED Treatments / Results  Labs (all labs ordered are listed, but only abnormal results are displayed) Labs Reviewed  BASIC METABOLIC PANEL - Abnormal; Notable for the following components:      Result Value   Glucose, Bld 142 (*)    All other components within normal limits  CBC  I-STAT TROPONIN, ED  I-STAT TROPONIN, ED    EKG  EKG Interpretation  Date/Time:  Tuesday December 06 2017 17:35:23 EST Ventricular Rate:  80 PR Interval:    QRS Duration: 95 QT Interval:  368 QTC Calculation: 425 R Axis:   62 Text Interpretation:  Sinus rhythm Borderline short PR interval ST elev, probable normal early repol pattern No significant change since last tracing Confirmed by Vanetta Mulders [phone]) on 12/06/2017 8:35:48 PM       Radiology Dg Chest 2 View  Result Date: 12/06/2017 CLINICAL DATA:  Chest pain for 1 week EXAM: CHEST  2 VIEW COMPARISON:  January 04, 2013 FINDINGS: The heart size and mediastinal contours are within normal  limits. Both lungs are clear. The visualized skeletal structures are unremarkable. IMPRESSION: No active cardiopulmonary disease. Electronically Signed   By: Sherian Rein M.D.   On: 12/06/2017 19:53    Procedures Procedures (including critical care time)  Medications Ordered in ED Medications - No data to display   Initial Impression / Assessment and Plan / ED Course  I have reviewed the triage vital signs and the nursing notes.  Pertinent labs & imaging results that were available during my care of the patient were reviewed by me and considered in my medical decision making (see chart for details).     22 y.o. male here with 1 wk of intermittent chest pain, had cough last month that resolved until today; denies hemoptysis or inspiratory pain despite triage note reporting that. States pain occurs with movement or coughing. Occasionally has wheezing. On exam, mild L sided chest wall TTP between the upper ribs, no crepitus or deformity, lungs clear with no wheezing, no pedal edema, no tachycardia or hypoxia. PERC negative, no RFs for PE, doubt PE as cause. Work up thus far reveals: EKG without acute ischemic findings, appears nearly identical from prior EKG in 2016, early repol but otherwise unremarkable. CBC WNL. Trop neg. BMP with gluc 142 but otherwise WNL. CXR neg. Overall this is likely musculoskeletal chest pain, probably from coughing; doubt pericarditis or acute cardiopulmonary etiology, however will get repeat troponin to ensure no change. Pt currently without pain, declines wanting anything for it at this time. Will reassess shortly.   9:58 PM Second trop negative. Pt continues to deny ongoing symptoms. Given 2 negative troponins and reassuring work up, and low HEART score, doubt need for further emergent work up at this time. Likely musculoskeletal chest wall pain from recent cough. Doubt pericarditis, but given recent viral illness, it's still on the list of differentials. Will treat  with NSAID therapy x2wks which would treat for both musculoskeletal pain and pericarditis. Advised avoidance of strenuous activity until cleared by PCP. Discussed other OTC remedies for pain control and for his cough symptoms. Advised adequate hydration as well as heat use. F/up with [facility] in 1wk for recheck of symptoms and to establish medical care. I explained the diagnosis and have given explicit precautions to return to the ER including for any other new or worsening symptoms. The patient understands and accepts the medical plan as it's been dictated and I have answered their questions. Discharge instructions concerning home care and prescriptions have been given. The patient is STABLE and is discharged to home in good condition.    Final Clinical Impressions(s) / ED Diagnoses   Final diagnoses:  Cough  Chest wall pain    ED Discharge Orders        Ordered    ibuprofen (ADVIL,MOTRIN) 600 MG  tablet  3 times daily     12/06/17 [address]2149       Yanelle Sousa, [place], [place] JerseyPA-C 12/06/17 2158    Vanetta MuldersZackowski, Scott, MD 12/06/17 [phone]

## 2017-12-06 NOTE — ED Triage Notes (Signed)
Patient states he began having chest pain approx 1 week ago and only happens at night. Patient states pain is worse with movement and taking a deep breath. patient states he had a cough a month ago and stopped after taking Mucinex and returned today and is non productive. Patient states cough a month ago had blood in his cough.

## 2018-01-03 ENCOUNTER — Encounter (HOSPITAL_COMMUNITY): Payer: Self-pay

## 2018-01-03 ENCOUNTER — Emergency Department (HOSPITAL_COMMUNITY)
Admission: EM | Admit: 2018-01-03 | Discharge: 2018-01-03 | Disposition: A | Discharge status: Home / Self Care | Payer: Self-pay | Attending: Emergency Medicine | Admitting: Emergency Medicine

## 2018-01-03 DIAGNOSIS — H109 Unspecified conjunctivitis: Secondary | ICD-10-CM | POA: Insufficient documentation

## 2018-01-03 MED ORDER — POLYMYXIN B-TRIMETHOPRIM 10000-0.1 UNIT/ML-% OP SOLN
2.0000 [drp] | OPHTHALMIC | Status: DC
  Administered 2018-01-03: 2 [drp] via OPHTHALMIC
  Filled 2018-01-03: qty 10

## 2018-01-03 NOTE — Discharge Instructions (Signed)
Please instill 2 drops in each eye 4 times daily for 7 days.

## 2018-01-03 NOTE — ED Provider Notes (Signed)
[facility]-EMERGENCY DEPT Provider Note   CSN: [id number] Arrival date & time: 01/03/18  2105     History   Chief Complaint Chief Complaint  Patient presents with  . Conjunctivitis    HPI Carlos Cruz is a 22 y.o. male.  Patient presents to the ED with a chief complaint of bilateral conjunctivitis with the left worse than the right.  He states that family was sick with the same.  He reports blurred vision and irritated feeling eye with thick discharge.  He denies fever or chills.  Denies pain with eye movement.  Denies flashers or floaters.   The history is provided by the patient. No language interpreter was used.    Past Medical History:  Diagnosis Date  . Attention deficit     There are no active problems to display for this patient.   Past Surgical History:  Procedure Laterality Date  . CYSTOSCOPY         Home Medications    Prior to Admission medications   Medication Sig Start Date End Date Taking? Authorizing Provider  dicyclomine (BENTYL) 20 MG tablet Take 1 tablet (20 mg total) by mouth 2 (two) times daily. Patient not taking: Reported on 08/13/2016 07/11/16   Antony MaduraHumes, Kelly, PA-C  Lactobacillus Delia Heady(LACTINEX) PACK Mix 1/2 packet with soft food and take twice a day for 5 days. Patient not taking: Reported on 08/13/2016 07/11/16   Antony MaduraHumes, Kelly, PA-C    Family History History reviewed. No pertinent family history.  Social History Social History   Tobacco Use  . Smoking status: Never Smoker  . Smokeless tobacco: Never Used  Substance Use Topics  . Alcohol use: Yes    Comment: occasionally  . Drug use: No     Allergies   Ciprofloxacin   Review of Systems Review of Systems  All other systems reviewed and are negative.    Physical Exam Updated Vital Signs There were no vitals taken for this visit.  Physical Exam  Constitutional: He is oriented to person, place, and time. He appears well-developed and well-nourished.    HENT:  Head: Normocephalic and atraumatic.  Eyes: Conjunctivae and EOM are normal.  Bilateral conjunctivae are erythematous with some thick discharge from left  L 20/40 R 20/25 Normal EOMs No periorbital tenderness  Neck: Normal range of motion.  Cardiovascular: Normal rate.  Pulmonary/Chest: Effort normal.  Abdominal: He exhibits no distension.  Musculoskeletal: Normal range of motion.  Neurological: He is alert and oriented to person, place, and time.  Skin: Skin is dry.  Psychiatric: He has a normal mood and affect. His behavior is normal. Judgment and thought content normal.  Nursing note and vitals reviewed.    ED Treatments / Results  Labs (all labs ordered are listed, but only abnormal results are displayed) Labs Reviewed - No data to display  EKG  EKG Interpretation None       Radiology No results found.  Procedures Procedures (including critical care time)  Medications Ordered in ED Medications  trimethoprim-polymyxin b (POLYTRIM) ophthalmic solution 2 drop (not administered)     Initial Impression / Assessment and Plan / ED Course  I have reviewed the triage vital signs and the nursing notes.  Pertinent labs & imaging results that were available during my care of the patient were reviewed by me and considered in my medical decision making (see chart for details).    Patient with conjunctivitis.  Normal EOMs.  Slightly blurred vision.  Afebrile.  Non-toxic appearing.  VSS.   DC with polytrim.  Final Clinical Impressions(s) / ED Diagnoses   Final diagnoses:  Conjunctivitis of both eyes, unspecified conjunctivitis type    ED Discharge Orders    None       Roxy Horseman, PA-C 01/03/18 2348    Alvira Monday, MD 01/05/18 [phone]

## 2018-01-03 NOTE — ED Notes (Signed)
Pt also complains of a sore throat 

## 2018-10-20 ENCOUNTER — Emergency Department (HOSPITAL_COMMUNITY)
Admission: EM | Admit: 2018-10-20 | Discharge: 2018-10-20 | Disposition: A | Discharge status: Home / Self Care | Payer: Self-pay | Attending: Emergency Medicine | Admitting: Emergency Medicine

## 2018-10-20 ENCOUNTER — Emergency Department (HOSPITAL_COMMUNITY): Payer: Self-pay

## 2018-10-20 DIAGNOSIS — R350 Frequency of micturition: Secondary | ICD-10-CM | POA: Insufficient documentation

## 2018-10-20 DIAGNOSIS — K5900 Constipation, unspecified: Secondary | ICD-10-CM | POA: Insufficient documentation

## 2018-10-20 LAB — URINALYSIS, ROUTINE W REFLEX MICROSCOPIC
Bilirubin Urine: NEGATIVE
GLUCOSE, UA: NEGATIVE mg/dL
Hgb urine dipstick: NEGATIVE
KETONES UR: NEGATIVE mg/dL
LEUKOCYTES UA: NEGATIVE
NITRITE: NEGATIVE
PH: 7 (ref 5.0–8.0)
Protein, ur: NEGATIVE mg/dL
SPECIFIC GRAVITY, URINE: 1.026 (ref 1.005–1.030)

## 2018-10-20 NOTE — ED Provider Notes (Signed)
[facility]-EMERGENCY DEPT Provider Note   CSN: [id number] Arrival date & time: 10/20/18  2034     History   Chief Complaint No chief complaint on file.   HPI Carlos Cruz is a 22 y.o. male.  22 year old male presents with 2-day history of persistent left-sided abdominal pain.  Notes constipation and still is passing flatus.  Has also had some urinary frequency and occasional left-sided flank pain.  Denies any dysuria.  No vomiting or diarrhea.  No cough or congestion.  No sore throat.  Symptoms persistent and nothing makes them better.  No treatment use prior to arrival.     Past Medical History:  Diagnosis Date  . Attention deficit     There are no active problems to display for this patient.   Past Surgical History:  Procedure Laterality Date  . CYSTOSCOPY          Home Medications    Prior to Admission medications   Medication Sig Start Date End Date Taking? Authorizing Provider  dicyclomine (BENTYL) 20 MG tablet Take 1 tablet (20 mg total) by mouth 2 (two) times daily. Patient not taking: Reported on 08/13/2016 07/11/16   Antony MaduraHumes, Kelly, PA-C  Lactobacillus Delia Heady(LACTINEX) PACK Mix 1/2 packet with soft food and take twice a day for 5 days. Patient not taking: Reported on 08/13/2016 07/11/16   Antony MaduraHumes, Kelly, PA-C    Family History No family history on file.  Social History Social History   Tobacco Use  . Smoking status: Never Smoker  . Smokeless tobacco: Never Used  Substance Use Topics  . Alcohol use: Yes    Comment: occasionally  . Drug use: No     Allergies   Ciprofloxacin   Review of Systems Review of Systems  All other systems reviewed and are negative.    Physical Exam Updated Vital Signs BP 123/77 (BP Location: Left Arm)   Pulse 77   Temp 99.3 F (37.4 C)   Resp 18   Ht 1.626 m (5\' 4" )   Wt 69.4 kg   SpO2 98%   BMI 26.26 kg/m   Physical Exam Vitals signs and nursing note reviewed.  Constitutional:    General: He is not in acute distress.    Appearance: Normal appearance. He is well-developed. He is not toxic-appearing.  HENT:     Head: Normocephalic and atraumatic.  Eyes:     General: Lids are normal.     Conjunctiva/sclera: Conjunctivae normal.     Pupils: Pupils are equal, round, and reactive to light.  Neck:     Musculoskeletal: Normal range of motion and neck supple.     Thyroid: No thyroid mass.     Trachea: No tracheal deviation.  Cardiovascular:     Rate and Rhythm: Normal rate and regular rhythm.     Heart sounds: Normal heart sounds. No murmur. No gallop.   Pulmonary:     Effort: Pulmonary effort is normal. No respiratory distress.     Breath sounds: Normal breath sounds. No stridor. No decreased breath sounds, wheezing, rhonchi or rales.  Abdominal:     General: Bowel sounds are normal. There is no distension.     Palpations: Abdomen is soft.     Tenderness: There is abdominal tenderness in the left upper quadrant. There is no rebound.    Musculoskeletal: Normal range of motion.        General: No tenderness.  Skin:    General: Skin is warm and dry.  Findings: No abrasion or rash.  Neurological:     Mental Status: He is alert and oriented to person, place, and time.     GCS: GCS eye subscore is 4. GCS verbal subscore is 5. GCS motor subscore is 6.     Cranial Nerves: No cranial nerve deficit.     Sensory: No sensory deficit.  Psychiatric:        Speech: Speech normal.        Behavior: Behavior normal.      ED Treatments / Results  Labs (all labs ordered are listed, but only abnormal results are displayed) Labs Reviewed  URINE CULTURE  URINALYSIS, ROUTINE W REFLEX MICROSCOPIC    EKG None  Radiology No results found.  Procedures Procedures (including critical care time)  Medications Ordered in ED Medications - No data to display   Initial Impression / Assessment and Plan / ED Course  I have reviewed the triage vital signs and the nursing  notes.  Pertinent labs & imaging results that were available during my care of the patient were reviewed by me and considered in my medical decision making (see chart for details).     Urinalysis negative.  Acute abdominal series consistent with constipation.  Stable for discharge  Final Clinical Impressions(s) / ED Diagnoses   Final diagnoses:  None    ED Discharge Orders    None       Lorre Nick, MD 10/20/18 2217

## 2018-10-20 NOTE — Discharge Instructions (Addendum)
Purchase Colace or magnesium citrate to help with your constipation

## 2018-10-22 LAB — URINE CULTURE: Culture: 10000 — AB

## 2018-10-23 ENCOUNTER — Telehealth: Payer: Self-pay | Admitting: Emergency Medicine

## 2018-10-23 NOTE — Telephone Encounter (Signed)
Post ED Visit - Positive Culture Follow-up  Culture report reviewed by antimicrobial stewardship pharmacist:  []  Enzo BiNathan Batchelder, Pharm.D. []  Celedonio MiyamotoJeremy Frens, Pharm.D., BCPS AQ-ID []  Garvin FilaMike Maccia, Pharm.D., BCPS []  Georgina PillionElizabeth Martin, Pharm.D., BCPS []  Nettle LakeMinh Pham, [place]Pharm.D., BCPS, AAHIVP []  Estella HuskMichelle Turner, Pharm.D., BCPS, AAHIVP []  Lysle Pearlachel Rumbarger, PharmD, BCPS []  Phillips Climeshuy Dang, PharmD, BCPS [x]  Agapito GamesAlison Masters, PharmD, BCPS []  Verlan FriendsErin Deja, PharmD  Positive Urine culture no further patient follow-up is required at this time.  Carollee HerterShannon Olufemi Mofield 10/23/2018, 9:29 AM

## 2018-11-09 ENCOUNTER — Other Ambulatory Visit: Payer: Self-pay

## 2018-11-09 DIAGNOSIS — Z13228 Encounter for screening for other metabolic disorders: Secondary | ICD-10-CM

## 2018-11-09 NOTE — Progress Notes (Signed)
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 Partner would like testing today. Crystal with [employer] assisted with order.    Duane Lope, NP 11/09/2018 5:17 PM

## 2018-11-13 LAB — CYSTIC FIBROSIS MUTATION 97: GENE DIS ANAL CARRIER INTERP BLD/T-IMP: NOT DETECTED

## 2018-11-23 ENCOUNTER — Encounter (HOSPITAL_COMMUNITY): Payer: Self-pay

## 2018-11-23 ENCOUNTER — Emergency Department (HOSPITAL_COMMUNITY)
Admission: EM | Admit: 2018-11-23 | Discharge: 2018-11-23 | Disposition: A | Discharge status: Home / Self Care | Payer: Self-pay | Attending: Emergency Medicine | Admitting: Emergency Medicine

## 2018-11-23 ENCOUNTER — Other Ambulatory Visit: Payer: Self-pay

## 2018-11-23 DIAGNOSIS — J020 Streptococcal pharyngitis: Secondary | ICD-10-CM | POA: Insufficient documentation

## 2018-11-23 LAB — GROUP A STREP BY PCR: Group A Strep by PCR: DETECTED — AB

## 2018-11-23 MED ORDER — PENICILLIN G BENZATHINE 1200000 UNIT/2ML IM SUSP
1.2000 10*6.[IU] | Freq: Once | INTRAMUSCULAR | Status: AC
  Administered 2018-11-23: 1.2 10*6.[IU] via INTRAMUSCULAR
  Filled 2018-11-23: qty 2

## 2018-11-23 NOTE — Discharge Instructions (Signed)
Please follow up with your primary care provider within 5-7 days for re-evaluation of your symptoms. If you do not have a primary care provider, information for a healthcare clinic has been provided for you to make arrangements for follow up care. Please return to the emergency department for any new or worsening symptoms. ° °

## 2018-11-23 NOTE — ED Triage Notes (Signed)
Pt reports sore throat that started last week. No fever, denies cough or vomiting. A&Ox4. Ambulatory.

## 2018-11-23 NOTE — ED Provider Notes (Signed)
[facility] Provider Note   CSN: [id number] Arrival date & time: 11/23/18  1941     History   Chief Complaint Chief Complaint  Patient presents with  . Sore Throat    HPI Carlos Cruz is a 23 y.o. male.  HPI  Patient is a 23 year old male with history of ADHD presents the emergency department today complaining of a sore throat for the last 2 weeks.  Denies any rhinorrhea, congestion, cough.  Has felt like he has had subjective fevers.  Has been taking Tylenol and Motrin for his symptoms.  Denies abdominal pain, nausea vomiting diarrhea or urinary symptoms.  Rates pain as 5/10.  Pain is constant.  Past Medical History:  Diagnosis Date  . Attention deficit     There are no active problems to display for this patient.   Past Surgical History:  Procedure Laterality Date  . CYSTOSCOPY          Home Medications    Prior to Admission medications   Medication Sig Start Date End Date Taking? Authorizing Provider  dicyclomine (BENTYL) 20 MG tablet Take 1 tablet (20 mg total) by mouth 2 (two) times daily. Patient not taking: Reported on 08/13/2016 07/11/16   Antony Madura, PA-C  Lactobacillus Delia Heady) PACK Mix 1/2 packet with soft food and take twice a day for 5 days. Patient not taking: Reported on 08/13/2016 07/11/16   Antony Madura, PA-C    Family History History reviewed. No pertinent family history.  Social History Social History   Tobacco Use  . Smoking status: Never Smoker  . Smokeless tobacco: Never Used  Substance Use Topics  . Alcohol use: Yes    Comment: occasionally  . Drug use: No     Allergies   Ciprofloxacin   Review of Systems Review of Systems  Constitutional: Negative for fever.  HENT: Positive for sore throat. Negative for congestion and rhinorrhea.   Respiratory: Negative for cough and shortness of breath.   Cardiovascular: Negative for chest pain.  Gastrointestinal: Negative for abdominal pain,  constipation, diarrhea, nausea and vomiting.  Genitourinary: Negative for dysuria.  Musculoskeletal: Negative for back pain.  Skin: Negative for rash.  Neurological: Negative for headaches.     Physical Exam Updated Vital Signs BP 130/82 (BP Location: Right Arm)   Pulse 93   Temp 98.7 F (37.1 C) (Oral)   Resp 16   SpO2 100%   Physical Exam Vitals signs and nursing note reviewed.  Constitutional:      Appearance: He is well-developed. He is not ill-appearing or toxic-appearing.  HENT:     Head: Normocephalic and atraumatic.     Right Ear: Tympanic membrane normal.     Left Ear: Tympanic membrane normal.     Nose: No congestion or rhinorrhea.     Mouth/Throat:     Mouth: Mucous membranes are moist.     Tonsils: No tonsillar exudate.     Comments: Mild pharyngeal erythema and tonsillar edema.  Uvula midline.  No evidence of PTA or retropharyngeal abscess. Eyes:     Conjunctiva/sclera: Conjunctivae normal.  Neck:     Musculoskeletal: Neck supple.  Cardiovascular:     Rate and Rhythm: Normal rate and regular rhythm.     Heart sounds: Normal heart sounds. No murmur.  Pulmonary:     Effort: Pulmonary effort is normal. No respiratory distress.     Breath sounds: Normal breath sounds.  Abdominal:     Palpations: Abdomen is soft.  Tenderness: There is no abdominal tenderness.  Skin:    General: Skin is warm and dry.  Neurological:     Mental Status: He is alert.  Psychiatric:        Mood and Affect: Mood normal.      ED Treatments / Results  Labs (all labs ordered are listed, but only abnormal results are displayed) Labs Reviewed  GROUP A STREP BY PCR - Abnormal; Notable for the following components:      Result Value   Group A Strep by PCR DETECTED (*)    All other components within normal limits    EKG None  Radiology No results found.  Procedures Procedures (including critical care time)  Medications Ordered in ED Medications  penicillin g  benzathine (BICILLIN LA) 1200000 UNIT/2ML injection 1.2 Million Units (has no administration in time range)     Initial Impression / Assessment and Plan / ED Course  I have reviewed the triage vital signs and the nursing notes.  Pertinent labs & imaging results that were available during my care of the patient were reviewed by me and considered in my medical decision making (see chart for details).     Final Clinical Impressions(s) / ED Diagnoses   Final diagnoses:  Strep pharyngitis   Pt febrile with tonsillar exudate, cervical lymphadenopathy, & dysphagia; diagnosis of strep. Treated in the Ed with PCN IM.  Pt appears mildly dehydrated, discussed importance of water rehydration. Presentation non concerning for PTA or infxn spread to soft tissue. No trismus or uvula deviation. Specific return precautions discussed. Pt able to drink water without difficulty with intact air way. Recommended PCP follow up.    ED Discharge Orders    None       Rayne Du 11/23/18 2235    Gerhard Munch, MD 11/23/18 2340

## 2019-08-31 ENCOUNTER — Other Ambulatory Visit: Payer: Self-pay

## 2019-08-31 ENCOUNTER — Encounter (HOSPITAL_COMMUNITY): Payer: Self-pay | Admitting: Emergency Medicine

## 2019-08-31 ENCOUNTER — Emergency Department (HOSPITAL_COMMUNITY)
Admission: EM | Admit: 2019-08-31 | Discharge: 2019-08-31 | Disposition: A | Discharge status: Home / Self Care | Payer: Self-pay | Attending: Emergency Medicine | Admitting: Emergency Medicine

## 2019-08-31 DIAGNOSIS — R05 Cough: Secondary | ICD-10-CM | POA: Insufficient documentation

## 2019-08-31 DIAGNOSIS — J45909 Unspecified asthma, uncomplicated: Secondary | ICD-10-CM | POA: Insufficient documentation

## 2019-08-31 DIAGNOSIS — R0981 Nasal congestion: Secondary | ICD-10-CM | POA: Insufficient documentation

## 2019-08-31 DIAGNOSIS — Z20828 Contact with and (suspected) exposure to other viral communicable diseases: Secondary | ICD-10-CM | POA: Insufficient documentation

## 2019-08-31 DIAGNOSIS — R079 Chest pain, unspecified: Secondary | ICD-10-CM | POA: Insufficient documentation

## 2019-08-31 DIAGNOSIS — R059 Cough, unspecified: Secondary | ICD-10-CM

## 2019-08-31 HISTORY — DX: Unspecified asthma, uncomplicated: J45.909

## 2019-08-31 MED ORDER — AZITHROMYCIN 250 MG PO TABS
500.0000 mg | ORAL_TABLET | Freq: Once | ORAL | Status: AC
  Administered 2019-08-31: 21:00:00 500 mg via ORAL
  Filled 2019-08-31: qty 2

## 2019-08-31 MED ORDER — BENZONATATE 100 MG PO CAPS: 100.0000 mg | ORAL_CAPSULE | Freq: Three times a day (TID) | ORAL | 0 refills | Status: DC

## 2019-08-31 MED ORDER — AZITHROMYCIN 250 MG PO TABS: 250.0000 mg | ORAL_TABLET | Freq: Every day | ORAL | 0 refills | Status: AC

## 2019-08-31 NOTE — ED Triage Notes (Signed)
Patient believe he has had mucus in his chest that he has been trying to cough up for about a week. Coughs have been non productive and have caused a sore throat and chest pain. He also noticed "crackling" in his chest with breathing. The patient also reports having a loss/ decrease in taste and smell. Coughing also sometimes leads to vomiting.

## 2019-08-31 NOTE — ED Provider Notes (Signed)
[facility] DEPT Provider Note   CSN: [id number] Arrival date & time: 08/31/19  1755     History   Chief Complaint Chief Complaint  Patient presents with  . Cough  . Chest Pain    HPI Carlos Cruz is a 23 y.o. male.     HPI Patient presents with concern of cough, congestion. Onset was about 5 days ago. Since onset symptoms have been persistent, not improved with multiple OTC medication. There is some discomfort with coughing, but no true chest pain, no abdominal pain, no vomiting.  Patient was well prior to the onset of illness. Patient smokes marijuana, not cigarettes. He has no confirmed sick contacts.  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 Past Medical History:  Diagnosis Date  . Asthma   . Attention deficit     There are no active problems to display for this patient.   Past Surgical History:  Procedure Laterality Date  . CYSTOSCOPY          Home Medications    Prior to Admission medications   Medication Sig Start Date End Date Taking? Authorizing Provider  azithromycin (ZITHROMAX) 250 MG tablet Take 1 tablet (250 mg total) by mouth daily for 4 days. Take 1 every day until finished. 08/31/19 09/04/19  Carmin Muskrat, MD  benzonatate (TESSALON) 100 MG capsule Take 1 capsule (100 mg total) by mouth every 8 (eight) hours. 08/31/19   Carmin Muskrat, MD  dicyclomine (BENTYL) 20 MG tablet Take 1 tablet (20 mg total) by mouth 2 (two) times daily. Patient not taking: Reported on 08/13/2016 07/11/16   Antonietta Breach, PA-C  Lactobacillus Esau Grew) PACK Mix 1/2 packet with soft food and take twice a day for 5 days. Patient not taking: Reported on 08/13/2016 07/11/16   Antonietta Breach, PA-C    Family History History reviewed. No pertinent family history.  Social History Social History   Tobacco Use  . Smoking status: Never Smoker  . Smokeless tobacco: Never Used  Substance Use  Topics  . Alcohol use: Yes    Comment: occasionally  . Drug use: No     Allergies   Ciprofloxacin   Review of Systems Review of Systems  Constitutional:       Per HPI, otherwise negative  HENT:       Per HPI, otherwise negative  Respiratory:       Per HPI, otherwise negative  Cardiovascular:       Per HPI, otherwise negative  Gastrointestinal: Negative for vomiting.  Endocrine:       Negative aside from HPI  Genitourinary:       Neg aside from HPI   Musculoskeletal:       Per HPI, otherwise negative  Skin: Negative.   Neurological: Negative for syncope.     Physical Exam Updated Vital Signs BP 125/71 (BP Location: Left Arm)   Pulse 86   Temp 98.3 F (36.8 C) (Oral)   Resp 16   Ht 5\' 2"  (1.575 m)   SpO2 100%   BMI 27.98 kg/m   Physical Exam Vitals signs and nursing note reviewed.  Constitutional:      General: He is not in acute distress.    Appearance: He is well-developed.  HENT:     Head: Normocephalic and atraumatic.  Eyes:     Conjunctiva/sclera: Conjunctivae normal.  Cardiovascular:     Rate and Rhythm: Normal rate and regular rhythm.  Pulmonary:  Effort: Pulmonary effort is normal. No respiratory distress.     Breath sounds: No stridor. No wheezing.  Abdominal:     General: There is no distension.  Skin:    General: Skin is warm and dry.  Neurological:     Mental Status: He is alert and oriented to person, place, and time.      ED Treatments / Results  Labs (all labs ordered are listed, but only abnormal results are displayed) Labs Reviewed  SARS CORONAVIRUS 2 (TAT 6-24 HRS)    Procedures Procedures (including critical care time)  Medications Ordered in ED Medications  azithromycin (ZITHROMAX) tablet 500 mg (has no administration in time range)     Initial Impression / Assessment and Plan / ED Course  I have reviewed the triage vital signs and the nursing notes.  Pertinent labs & imaging results that were available  during my care of the patient were reviewed by me and considered in my medical decision making (see chart for details).  After the initial evaluation I discussed work-up with the patient. Patient notes that he is in a hurry, declines recommendation for chest x-ray to exclude possibility of pneumonia. He requests antibiotics. Though we discussed him that absent abnormal breath sounds, with no fever, bacterial pneumonia is less likely, the patient made this request and it was accommodated. Covid test pending. With no hemodynamic stability, the patient is appropriate for discharge, likely regardless of x-ray results, but without this study, work-up is somewhat incomplete. Patient started on medication, discharged with Covid test pending.  Carlos Cruz was evaluated in Emergency Department on 08/31/2019 for the symptoms described in the history of present illness. He was evaluated in the context of the global COVID-19 pandemic, which necessitated consideration that the patient might be at risk for infection with the SARS-CoV-2 virus that causes COVID-19. Institutional protocols and algorithms that pertain to the evaluation of patients at risk for COVID-19 are in a state of rapid change based on information released by regulatory bodies including the CDC and federal and state organizations. These policies and algorithms were followed during the patient's care in the ED.   Final Clinical Impressions(s) / ED Diagnoses   Final diagnoses:  Cough    ED Discharge Orders         Ordered    azithromycin (ZITHROMAX) 250 MG tablet  Daily     08/31/19 2010    benzonatate (TESSALON) 100 MG capsule  Every 8 hours     08/31/19 2010           Gerhard Munch, MD 08/31/19 2012

## 2019-08-31 NOTE — Discharge Instructions (Signed)
As discussed, a Covid test is pending. This results should be available in the next 3 days.  Please self monitor and quarantine until you have that result. Otherwise, please obtain your medications and take them as directed for additional symptom relief.  Return here for concerning changes in your condition.

## 2019-09-01 LAB — SARS CORONAVIRUS 2 (TAT 6-24 HRS): SARS Coronavirus 2: NEGATIVE

## 2020-12-16 ENCOUNTER — Other Ambulatory Visit: Payer: Self-pay

## 2020-12-16 ENCOUNTER — Emergency Department (HOSPITAL_COMMUNITY)
Admission: EM | Admit: 2020-12-16 | Discharge: 2020-12-17 | Disposition: A | Discharge status: Home / Self Care | Payer: Self-pay | Attending: Emergency Medicine | Admitting: Emergency Medicine

## 2020-12-16 ENCOUNTER — Emergency Department (HOSPITAL_COMMUNITY): Payer: Self-pay

## 2020-12-16 ENCOUNTER — Encounter (HOSPITAL_COMMUNITY): Payer: Self-pay | Admitting: Emergency Medicine

## 2020-12-16 DIAGNOSIS — Y92009 Unspecified place in unspecified non-institutional (private) residence as the place of occurrence of the external cause: Secondary | ICD-10-CM | POA: Insufficient documentation

## 2020-12-16 DIAGNOSIS — T754XXA Electrocution, initial encounter: Secondary | ICD-10-CM | POA: Insufficient documentation

## 2020-12-16 DIAGNOSIS — J45909 Unspecified asthma, uncomplicated: Secondary | ICD-10-CM | POA: Insufficient documentation

## 2020-12-16 DIAGNOSIS — R202 Paresthesia of skin: Secondary | ICD-10-CM | POA: Insufficient documentation

## 2020-12-16 LAB — PROTIME-INR
INR: 1 (ref 0.8–1.2)
Prothrombin Time: 12.5 seconds (ref 11.4–15.2)

## 2020-12-16 LAB — I-STAT CHEM 8, ED
BUN: 14 mg/dL (ref 6–20)
Calcium, Ion: 1.15 mmol/L (ref 1.15–1.40)
Chloride: 105 mmol/L (ref 98–111)
Creatinine, Ser: 0.7 mg/dL (ref 0.61–1.24)
Glucose, Bld: 116 mg/dL — ABNORMAL HIGH (ref 70–99)
HCT: 39 % (ref 39.0–52.0)
Hemoglobin: 13.3 g/dL (ref 13.0–17.0)
Potassium: 3.8 mmol/L (ref 3.5–5.1)
Sodium: 142 mmol/L (ref 135–145)
TCO2: 26 mmol/L (ref 22–32)

## 2020-12-16 LAB — CK: Total CK: 193 U/L (ref 49–397)

## 2020-12-16 LAB — CBC WITH DIFFERENTIAL/PLATELET
Abs Immature Granulocytes: 0.01 10*3/uL (ref 0.00–0.07)
Basophils Absolute: 0 10*3/uL (ref 0.0–0.1)
Basophils Relative: 1 %
Eosinophils Absolute: 0.3 10*3/uL (ref 0.0–0.5)
Eosinophils Relative: 4 %
HCT: 39.4 % (ref 39.0–52.0)
Hemoglobin: 13.6 g/dL (ref 13.0–17.0)
Immature Granulocytes: 0 %
Lymphocytes Relative: 35 %
Lymphs Abs: 2 10*3/uL (ref 0.7–4.0)
MCH: 31.1 pg (ref 26.0–34.0)
MCHC: 34.5 g/dL (ref 30.0–36.0)
MCV: 90.2 fL (ref 80.0–100.0)
Monocytes Absolute: 0.6 10*3/uL (ref 0.1–1.0)
Monocytes Relative: 10 %
Neutro Abs: 2.8 10*3/uL (ref 1.7–7.7)
Neutrophils Relative %: 50 %
Platelets: 291 10*3/uL (ref 150–400)
RBC: 4.37 MIL/uL (ref 4.22–5.81)
RDW: 11.9 % (ref 11.5–15.5)
WBC: 5.7 10*3/uL (ref 4.0–10.5)
nRBC: 0 % (ref 0.0–0.2)

## 2020-12-16 LAB — COMPREHENSIVE METABOLIC PANEL
ALT: 82 U/L — ABNORMAL HIGH (ref 0–44)
AST: 35 U/L (ref 15–41)
Albumin: 4 g/dL (ref 3.5–5.0)
Alkaline Phosphatase: 67 U/L (ref 38–126)
Anion gap: 11 (ref 5–15)
BUN: 13 mg/dL (ref 6–20)
CO2: 21 mmol/L — ABNORMAL LOW (ref 22–32)
Calcium: 8.7 mg/dL — ABNORMAL LOW (ref 8.9–10.3)
Chloride: 106 mmol/L (ref 98–111)
Creatinine, Ser: 0.67 mg/dL (ref 0.61–1.24)
GFR, Estimated: >60 mL/min (ref >60)
Glucose, Bld: 116 mg/dL — ABNORMAL HIGH (ref 70–99)
Potassium: 3.9 mmol/L (ref 3.5–5.1)
Sodium: 138 mmol/L (ref 135–145)
Total Bilirubin: 0.4 mg/dL (ref 0.3–1.2)
Total Protein: 6.6 g/dL (ref 6.5–8.1)

## 2020-12-16 LAB — TYPE AND SCREEN
ABO/RH(D): A POS
Antibody Screen: NEGATIVE

## 2020-12-16 LAB — ETHANOL: Alcohol, Ethyl (B): <10 mg/dL (ref <10)

## 2020-12-16 LAB — LACTIC ACID, PLASMA: Lactic Acid, Venous: 1.5 mmol/L (ref 0.5–1.9)

## 2020-12-16 LAB — TROPONIN I (HIGH SENSITIVITY): Troponin I (High Sensitivity): 3 ng/L (ref <18)

## 2020-12-16 LAB — CBG MONITORING, ED: Glucose-Capillary: 129 mg/dL — ABNORMAL HIGH (ref 70–99)

## 2020-12-16 NOTE — ED Provider Notes (Signed)
[facility] EMERGENCY DEPARTMENT Provider Note   CSN: [id number] Arrival date & time: 12/16/20  2153     History Chief Complaint  Patient presents with  . Electric Shock    Carlos Cruz is a 25 y.o. male.  25 year old male presents by EMS for evaluation following reported electrocution.  Patient apparently was at home.  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  His wife apparently witnessed the reported electrocution.  The patient's left hand apparently was on the surrogate panel.  The patient appeared to be receiving a shock.  He could not remove his hand for approximately 3 minutes per EMS report.  After this he collapsed to the floor and struck his head against the ground.  Upon arrival to the ED the patient is amnestic to the event.  He cannot give me any specific details as to what happened at home.  The history is provided by the patient, the EMS personnel and medical records.  Illness Location:  Reported electrocution, arrives from home Severity:  Mild Onset quality:  Sudden Duration:  30 minutes Timing:  Unable to specify Progression:  Improving Chronicity:  New      Past Medical History:  Diagnosis Date  . Asthma   . Attention deficit     There are no problems to display for this patient.   Past Surgical History:  Procedure Laterality Date  . CYSTOSCOPY         No family history on file.  Social History   Tobacco Use  . Smoking status: Never Smoker  . Smokeless tobacco: Never Used  Vaping Use  . Vaping Use: Never used  Substance Use Topics  . Alcohol use: Yes    Comment: occasionally  . Drug use: No    Home Medications Prior to Admission medications   Medication Sig Start Date End Date Taking? Authorizing Provider  benzonatate (TESSALON) 100 MG capsule Take 1 capsule (100 mg total) by mouth every 8 (eight) hours. 08/31/19   Gerhard Munch, MD  dicyclomine (BENTYL) 20 MG tablet Take 1 tablet (20 mg total)  by mouth 2 (two) times daily. Patient not taking: Reported on 08/13/2016 07/11/16   Antony Madura, PA-C  Lactobacillus Delia Heady) PACK Mix 1/2 packet with soft food and take twice a day for 5 days. Patient not taking: Reported on 08/13/2016 07/11/16   Antony Madura, PA-C    Allergies    Ciprofloxacin  Review of Systems   Review of Systems  All other systems reviewed and are negative.   Physical Exam Updated Vital Signs BP (!) 142/84   Pulse 74   Temp 97.7 F (36.5 C) (Oral)   Resp 20   Ht 5\' 11"  (1.803 m)   Wt 79.4 kg   SpO2 100%   BMI 24.41 kg/m   Physical Exam Vitals and nursing note reviewed.  Constitutional:      General: He is not in acute distress.    Appearance: Normal appearance. He is well-developed and well-nourished.  HENT:     Head: Normocephalic and atraumatic.     Mouth/Throat:     Mouth: Oropharynx is clear and moist.  Eyes:     Extraocular Movements: EOM normal.     Conjunctiva/sclera: Conjunctivae normal.     Pupils: Pupils are equal, round, and reactive to light.  Cardiovascular:     Rate and Rhythm: Normal rate and regular rhythm.     Heart sounds: Normal heart sounds.  Pulmonary:  Effort: Pulmonary effort is normal. No respiratory distress.     Breath sounds: Normal breath sounds.  Abdominal:     General: There is no distension.     Palpations: Abdomen is soft.     Tenderness: There is no abdominal tenderness.  Genitourinary:    Comments: Normal peri-rectal sensation Musculoskeletal:        General: No deformity or edema. Normal range of motion.     Cervical back: Normal range of motion and neck supple.  Skin:    General: Skin is warm and dry.     Comments: No apparent electrical burn on either hand  Neurological:     General: No focal deficit present.     Mental Status: He is alert.     Comments: Patient is alert and oriented to person and place He is amnestic to the events leading to his arrival  He is somewhat confused  No focal  neuro deficit appreciated  DTR's to BLE intact  Psychiatric:        Mood and Affect: Mood and affect normal.     ED Results / Procedures / Treatments   Labs (all labs ordered are listed, but only abnormal results are displayed) Labs Reviewed  COMPREHENSIVE METABOLIC PANEL - Abnormal; Notable for the following components:      Result Value   CO2 21 (*)    Glucose, Bld 116 (*)    Calcium 8.7 (*)    ALT 82 (*)    All other components within normal limits  CBG MONITORING, ED - Abnormal; Notable for the following components:   Glucose-Capillary 129 (*)    All other components within normal limits  I-STAT CHEM 8, ED - Abnormal; Notable for the following components:   Glucose, Bld 116 (*)    All other components within normal limits  RESP PANEL BY RT-PCR (FLU A&B, COVID) ARPGX2  PROTIME-INR  CBC WITH DIFFERENTIAL/PLATELET  LACTIC ACID, PLASMA  ETHANOL  CK  RAPID URINE DRUG SCREEN, HOSP PERFORMED  URINALYSIS, ROUTINE W REFLEX MICROSCOPIC  TYPE AND SCREEN  ABO/RH  TROPONIN I (HIGH SENSITIVITY)    EKG None  Radiology CT Head Wo Contrast  Result Date: 12/16/2020 CLINICAL DATA:  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 Per EMS, pt unable to move his lower extremities. On arrival, pt unable to recall event, c/o tingling to lower extremities. EXAM: CT HEAD WITHOUT CONTRAST CT CERVICAL SPINE WITHOUT CONTRAST TECHNIQUE: Multidetector CT imaging of the head and cervical spine was performed following the standard protocol without intravenous contrast. Multiplanar CT image reconstructions of the cervical spine were also generated. COMPARISON:  Head CT August 13, 2016. FINDINGS: CT HEAD FINDINGS Brain: No evidence of acute infarction, hemorrhage, hydrocephalus, extra-axial collection or mass lesion/mass effect. Vascular: No hyperdense vessel or unexpected calcification. Skull: Normal. Negative for fracture or focal lesion. Sinuses/Orbits: No acute  finding. Other: None. CT CERVICAL SPINE FINDINGS Alignment: Normal. Skull base and vertebrae: No acute fracture. No primary bone lesion or focal pathologic process. Soft tissues and spinal canal: No prevertebral fluid or swelling. No visible canal hematoma. Disc levels:  Preserved Upper chest: Negative. Other: None IMPRESSION: 1. No acute intracranial pathology. 2. No fracture or static subluxation of the cervical spine. 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 Per  EMS, pt unable to move his lower extremities. On arrival, pt unable to recall event, c/o tingling to lower extremities. EXAM: CT HEAD WITHOUT CONTRAST CT CERVICAL SPINE WITHOUT CONTRAST TECHNIQUE: Multidetector CT imaging of the head and cervical spine was performed following the standard protocol without intravenous contrast. Multiplanar CT image reconstructions of the cervical spine were also generated. COMPARISON:  Head CT August 13, 2016. FINDINGS: CT HEAD FINDINGS Brain: No evidence of acute infarction, hemorrhage, hydrocephalus, extra-axial collection or mass lesion/mass effect. Vascular: No hyperdense vessel or unexpected calcification. Skull: Normal. Negative for fracture or focal lesion. Sinuses/Orbits: No acute finding. Other: None. CT CERVICAL SPINE FINDINGS Alignment: Normal. Skull base and vertebrae: No acute fracture. No primary bone lesion or focal pathologic process. Soft tissues and spinal canal: No prevertebral fluid or swelling. No visible canal hematoma. Disc levels:  Preserved Upper chest: Negative. Other: None IMPRESSION: 1. No acute intracranial pathology. 2. No fracture or static subluxation of the cervical spine. Electronically Signed   By: Maudry Mayhew MD   On: 12/16/2020 22:27    Procedures Procedures  CRITICAL CARE Performed by: Wynetta Fines   Total critical care time: 30 minutes  Critical care time was exclusive of separately billable procedures and treating other patients.  Critical care was necessary to treat or prevent imminent or life-threatening deterioration.  Critical care was time spent personally by me on the following activities: development of treatment plan with patient and/or surrogate as well as nursing, discussions with consultants, evaluation of patient's response to treatment, examination of patient, obtaining history from patient or surrogate, ordering and performing treatments and interventions, ordering and review of laboratory studies, ordering and review of radiographic studies, pulse oximetry and re-evaluation of patient's condition.   Medications Ordered in ED Medications - No data to display  ED Course  I have reviewed the triage vital signs and the nursing notes.  Pertinent labs & imaging results that were available during my care of the patient were reviewed by me and considered in my medical decision making (see chart for details).    MDM Rules/Calculators/A&P                          MDM  Screen complete  Durwin Davisson was evaluated in Emergency Department on 12/16/2020 for the symptoms described in the history of present illness. He was evaluated in the context of the global COVID-19 pandemic, which necessitated consideration that the patient might be at risk for infection with the SARS-CoV-2 virus that causes COVID-19. Institutional protocols and algorithms that pertain to the evaluation of patients at risk for COVID-19 are in a state of rapid change based on information released by regulatory bodies including the CDC and federal and state organizations. These policies and algorithms were followed during the patient's care in the ED.  Patient is presenting for reported electrical shock.  Patient with reported fall after the shock.  Patient is confused upon initial evaluation.  This confusion is clearing rapidly after the initial ED evaluation.  Patient does complain of feeling " tingly in both lower extremities."  Patient reports that this may be a chronic condition.  This is related to a fall that he had several years prior.  Patient does not want to move his lower extremities secondary to this " tingling discomfort."  We will obtain LS spine x-ray.  Patient without clear evidence of significant sequela from reported electric shock.  Pending labs and disposition signed out to  oncoming EDP (Mesner).     Final Clinical Impression(s) / ED Diagnoses Final diagnoses:  Electrical shock of hand, initial encounter    Rx / DC Orders ED Discharge Orders    None       Wynetta Fines, MD 12/16/20 2334

## 2020-12-16 NOTE — ED Triage Notes (Signed)
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 Per EMS, pt unable to move his lower extremities. On arrival, pt unable to recall event, c/o tingling to lower extremities.

## 2020-12-16 NOTE — ED Notes (Signed)
Patient transported to X-ray 

## 2020-12-17 LAB — TROPONIN I (HIGH SENSITIVITY): Troponin I (High Sensitivity): 2 ng/L (ref <18)

## 2020-12-17 NOTE — ED Notes (Signed)
Patient alert fluids offered and tolerated well. Patient up ambulating denies any dizzy. Tolerated well

## 2021-03-17 ENCOUNTER — Emergency Department (HOSPITAL_COMMUNITY): Payer: Self-pay

## 2021-03-17 ENCOUNTER — Emergency Department (HOSPITAL_COMMUNITY)
Admission: EM | Admit: 2021-03-17 | Discharge: 2021-03-17 | Disposition: A | Discharge status: Home / Self Care | Payer: Self-pay | Attending: Emergency Medicine | Admitting: Emergency Medicine

## 2021-03-17 ENCOUNTER — Encounter (HOSPITAL_COMMUNITY): Payer: Self-pay | Admitting: Emergency Medicine

## 2021-03-17 DIAGNOSIS — M542 Cervicalgia: Secondary | ICD-10-CM | POA: Insufficient documentation

## 2021-03-17 DIAGNOSIS — R202 Paresthesia of skin: Secondary | ICD-10-CM | POA: Insufficient documentation

## 2021-03-17 DIAGNOSIS — Z5321 Procedure and treatment not carried out due to patient leaving prior to being seen by health care provider: Secondary | ICD-10-CM | POA: Insufficient documentation

## 2021-03-17 NOTE — ED Provider Notes (Signed)
Emergency Medicine Provider Triage Evaluation Note  Carlos Cruz , a 25 y.o. male  was evaluated in triage.  Pt complains of neck pain.  This is been going on for about 3 months, gradually worsening.  He states every time he turns his neck, he feels a grinding sensation.  No numbness or tingling in his hands.  No fall, trauma, or injury.  No fevers or chills.  He tried Biofreeze, Tylenol, ibuprofen without improvement of symptoms.  No previous history of neck problems.  No other medical problems  Review of Systems  Positive: Neck pain Negative: Numbness, weakness  Physical Exam  BP (!) 148/93 (BP Location: Left Arm)   Pulse 78   Temp 98.3 F (36.8 C) (Oral)   Resp 16   SpO2 99%  Gen:   Awake, no distress   Resp:  Normal effort  MSK:   Moves extremities without difficulty.  Grip strength equal bilaterally.  Tenderness palpation over midline C-spine.  No step-offs or deformities.   Medical Decision Making  Medically screening exam initiated at 2:19 PM.  Appropriate orders placed.  Carlos Cruz was informed that the remainder of the evaluation will be completed by another provider, this initial triage assessment does not replace that evaluation, and the importance of remaining in the ED until their evaluation is complete.  Neck pain for several months with midline tenderness.  Will obtain CT to ensure no acute abnormalities    Alveria Apley, PA-C 03/17/21 1421    Arby Barrette, MD 03/26/21 1039

## 2021-03-17 NOTE — ED Triage Notes (Signed)
Pt c/o neck pain for 3 months. Reports does lots of heavy lifting at work.

## 2021-07-29 ENCOUNTER — Ambulatory Visit (INDEPENDENT_AMBULATORY_CARE_PROVIDER_SITE_OTHER): Payer: Self-pay | Admitting: Otolaryngology

## 2021-12-02 ENCOUNTER — Other Ambulatory Visit: Payer: Self-pay

## 2021-12-02 ENCOUNTER — Ambulatory Visit
Admission: EM | Admit: 2021-12-02 | Discharge: 2021-12-02 | Disposition: A | Discharge status: Home / Self Care | Payer: Self-pay

## 2023-06-01 ENCOUNTER — Encounter (HOSPITAL_COMMUNITY): Payer: Self-pay

## 2023-06-01 ENCOUNTER — Emergency Department (HOSPITAL_COMMUNITY): Payer: Self-pay

## 2023-06-01 ENCOUNTER — Other Ambulatory Visit: Payer: Self-pay

## 2023-06-01 ENCOUNTER — Observation Stay (HOSPITAL_COMMUNITY)
Admission: EM | Admit: 2023-06-01 | Discharge: 2023-06-02 | Disposition: A | Discharge status: Home / Self Care | Payer: Self-pay | Attending: General Surgery | Admitting: General Surgery

## 2023-06-01 ENCOUNTER — Observation Stay (HOSPITAL_COMMUNITY): Payer: Self-pay

## 2023-06-01 DIAGNOSIS — Z79899 Other long term (current) drug therapy: Secondary | ICD-10-CM | POA: Insufficient documentation

## 2023-06-01 DIAGNOSIS — F449 Dissociative and conversion disorder, unspecified: Secondary | ICD-10-CM

## 2023-06-01 DIAGNOSIS — R531 Weakness: Secondary | ICD-10-CM | POA: Diagnosis present

## 2023-06-01 DIAGNOSIS — W108XXA Fall (on) (from) other stairs and steps, initial encounter: Secondary | ICD-10-CM | POA: Diagnosis not present

## 2023-06-01 DIAGNOSIS — S060X0A Concussion without loss of consciousness, initial encounter: Secondary | ICD-10-CM | POA: Diagnosis not present

## 2023-06-01 DIAGNOSIS — H532 Diplopia: Secondary | ICD-10-CM | POA: Insufficient documentation

## 2023-06-01 DIAGNOSIS — J45909 Unspecified asthma, uncomplicated: Secondary | ICD-10-CM | POA: Insufficient documentation

## 2023-06-01 LAB — COMPREHENSIVE METABOLIC PANEL
ALT: 110 U/L — ABNORMAL HIGH (ref 0–44)
AST: 50 U/L — ABNORMAL HIGH (ref 15–41)
Albumin: 3.8 g/dL (ref 3.5–5.0)
Alkaline Phosphatase: 58 U/L (ref 38–126)
Anion gap: 11 (ref 5–15)
BUN: 13 mg/dL (ref 6–20)
CO2: 19 mmol/L — ABNORMAL LOW (ref 22–32)
Calcium: 8.8 mg/dL — ABNORMAL LOW (ref 8.9–10.3)
Chloride: 103 mmol/L (ref 98–111)
Creatinine, Ser: 0.74 mg/dL (ref 0.61–1.24)
GFR, Estimated: >60 mL/min (ref >60)
Glucose, Bld: 88 mg/dL (ref 70–99)
Potassium: 3.6 mmol/L (ref 3.5–5.1)
Sodium: 133 mmol/L — ABNORMAL LOW (ref 135–145)
Total Bilirubin: 0.6 mg/dL (ref 0.3–1.2)
Total Protein: 6.1 g/dL — ABNORMAL LOW (ref 6.5–8.1)

## 2023-06-01 LAB — HIV ANTIBODY (ROUTINE TESTING W REFLEX): HIV Screen 4th Generation wRfx: NONREACTIVE

## 2023-06-01 LAB — I-STAT CHEM 8, ED
BUN: 13 mg/dL (ref 6–20)
Calcium, Ion: 1.05 mmol/L — ABNORMAL LOW (ref 1.15–1.40)
Chloride: 104 mmol/L (ref 98–111)
Creatinine, Ser: 0.6 mg/dL — ABNORMAL LOW (ref 0.61–1.24)
Glucose, Bld: 93 mg/dL (ref 70–99)
HCT: 45 % (ref 39.0–52.0)
Hemoglobin: 15.3 g/dL (ref 13.0–17.0)
Potassium: 3.4 mmol/L — ABNORMAL LOW (ref 3.5–5.1)
Sodium: 137 mmol/L (ref 135–145)
TCO2: 19 mmol/L — ABNORMAL LOW (ref 22–32)

## 2023-06-01 LAB — CBG MONITORING, ED: Glucose-Capillary: 83 mg/dL (ref 70–99)

## 2023-06-01 LAB — CBC
HCT: 45.4 % (ref 39.0–52.0)
Hemoglobin: 15.6 g/dL (ref 13.0–17.0)
MCH: 29.9 pg (ref 26.0–34.0)
MCHC: 34.4 g/dL (ref 30.0–36.0)
MCV: 87 fL (ref 80.0–100.0)
Platelets: 285 10*3/uL (ref 150–400)
RBC: 5.22 MIL/uL (ref 4.22–5.81)
RDW: 12.1 % (ref 11.5–15.5)
WBC: 7.4 10*3/uL (ref 4.0–10.5)
nRBC: 0 % (ref 0.0–0.2)

## 2023-06-01 LAB — PROTIME-INR
INR: 1 (ref 0.8–1.2)
Prothrombin Time: 12.9 seconds (ref 11.4–15.2)

## 2023-06-01 LAB — SAMPLE TO BLOOD BANK

## 2023-06-01 LAB — I-STAT CG4 LACTIC ACID, ED: Lactic Acid, Venous: 1.9 mmol/L (ref 0.5–1.9)

## 2023-06-01 LAB — ETHANOL: Alcohol, Ethyl (B): <10 mg/dL (ref <10)

## 2023-06-01 MED ORDER — METOPROLOL TARTRATE 5 MG/5ML IV SOLN: 5.0000 mg | Freq: Four times a day (QID) | INTRAVENOUS | Status: DC | PRN

## 2023-06-01 MED ORDER — ACETAMINOPHEN 500 MG PO TABS
1000.0000 mg | ORAL_TABLET | Freq: Four times a day (QID) | ORAL | Status: DC | PRN
  Administered 2023-06-01 (×2): 1000 mg via ORAL
  Filled 2023-06-01 (×2): qty 2

## 2023-06-01 MED ORDER — METHOCARBAMOL 500 MG PO TABS
500.0000 mg | ORAL_TABLET | Freq: Three times a day (TID) | ORAL | Status: DC
  Administered 2023-06-01 – 2023-06-02 (×2): 500 mg via ORAL
  Filled 2023-06-01 (×3): qty 1

## 2023-06-01 MED ORDER — IOHEXOL 350 MG/ML SOLN
75.0000 mL | Freq: Once | INTRAVENOUS | Status: AC | PRN
  Administered 2023-06-01: 75 mL via INTRAVENOUS

## 2023-06-01 MED ORDER — ONDANSETRON HCL 4 MG/2ML IJ SOLN
4.0000 mg | Freq: Four times a day (QID) | INTRAMUSCULAR | Status: DC | PRN
  Administered 2023-06-01: 4 mg via INTRAVENOUS
  Filled 2023-06-01: qty 2

## 2023-06-01 MED ORDER — FENTANYL CITRATE PF 50 MCG/ML IJ SOSY
25.0000 ug | PREFILLED_SYRINGE | Freq: Once | INTRAMUSCULAR | Status: AC
  Administered 2023-06-01: 25 ug via INTRAVENOUS
  Filled 2023-06-01: qty 1

## 2023-06-01 MED ORDER — SODIUM CHLORIDE 0.9% FLUSH
3.0000 mL | Freq: Two times a day (BID) | INTRAVENOUS | Status: DC
  Administered 2023-06-01 (×2): 3 mL via INTRAVENOUS

## 2023-06-01 MED ORDER — SODIUM CHLORIDE 0.9 % IV SOLN: 250.0000 mL | INTRAVENOUS | Status: DC | PRN

## 2023-06-01 MED ORDER — ONDANSETRON 4 MG PO TBDP: 4.0000 mg | ORAL_TABLET | Freq: Four times a day (QID) | ORAL | Status: DC | PRN

## 2023-06-01 MED ORDER — DOCUSATE SODIUM 100 MG PO CAPS
100.0000 mg | ORAL_CAPSULE | Freq: Two times a day (BID) | ORAL | Status: DC
  Administered 2023-06-01 (×2): 100 mg via ORAL
  Filled 2023-06-01 (×3): qty 1

## 2023-06-01 MED ORDER — SODIUM CHLORIDE 0.9% FLUSH: 3.0000 mL | INTRAVENOUS | Status: DC | PRN

## 2023-06-01 MED ORDER — ENOXAPARIN SODIUM 30 MG/0.3ML IJ SOSY
30.0000 mg | PREFILLED_SYRINGE | Freq: Two times a day (BID) | INTRAMUSCULAR | Status: DC
  Filled 2023-06-01: qty 0.3

## 2023-06-01 MED ORDER — METHOCARBAMOL 1000 MG/10ML IJ SOLN
500.0000 mg | Freq: Three times a day (TID) | INTRAVENOUS | Status: DC
  Administered 2023-06-01: 500 mg via INTRAVENOUS
  Filled 2023-06-01: qty 500
  Filled 2023-06-01: qty 5

## 2023-06-01 MED ORDER — HYDRALAZINE HCL 20 MG/ML IJ SOLN: 10.0000 mg | INTRAMUSCULAR | Status: DC | PRN

## 2023-06-01 MED ORDER — GABAPENTIN 300 MG PO CAPS
300.0000 mg | ORAL_CAPSULE | Freq: Three times a day (TID) | ORAL | Status: DC
  Administered 2023-06-01 – 2023-06-02 (×2): 300 mg via ORAL
  Filled 2023-06-01 (×2): qty 1

## 2023-06-01 MED ORDER — IBUPROFEN 400 MG PO TABS
400.0000 mg | ORAL_TABLET | Freq: Four times a day (QID) | ORAL | Status: DC | PRN
  Administered 2023-06-01: 800 mg via ORAL
  Filled 2023-06-01 (×2): qty 2

## 2023-06-01 MED ORDER — POLYETHYLENE GLYCOL 3350 17 G PO PACK: 17.0000 g | PACK | Freq: Every day | ORAL | Status: DC | PRN

## 2023-06-01 MED ORDER — LORAZEPAM 2 MG/ML IJ SOLN
0.5000 mg | Freq: Once | INTRAMUSCULAR | Status: AC
  Administered 2023-06-01: 0.5 mg via INTRAVENOUS
  Filled 2023-06-01: qty 1

## 2023-06-01 NOTE — H&P (Signed)
[employer] [birth year]  [id number].     Chief Complaint/Reason for Consult: fall down steps  HPI:  This is a 27 yo male who is unable to provide a consistent history.  It sounds like he fell down some steps.  He initially says he can't remember what happened, but later stated he slipped down the steps.  He went to an urgent care in [facility] and complained of weakness in his lower extremities.  He was then sent via EMS to [facility] ED.  Here he complains of a bad HA, diplopia, initially left-sided weakness, then numbness in his face, severe back pain, IVs feel like they are tickling him, etc.  His vitals have been stable.  He underwent a trauma work up.  This was all negative.  He is currently undergoing an MRI of his head/neck.   ROS: ROSsee HPI  History reviewed. No pertinent family history.  Past Medical History:  Diagnosis Date   Asthma    Attention deficit     Past Surgical History:  Procedure Laterality Date   CYSTOSCOPY      Social History:  reports that he has never smoked. He has never used smokeless tobacco. He reports current alcohol use. He reports that he does not use drugs.  Allergies:  Allergies  Allergen Reactions   Ciprofloxacin Hives    (Not in a hospital admission)    Physical Exam: Blood pressure 118/82, pulse 75, temperature (!) 97.5 F (36.4 C), temperature source Oral, resp. rate 17, SpO2 100%. General: WD, WN white male who is laying in bed in moderate distress at times HEENT: head is normocephalic, atraumatic.  Sclera are noninjected.  PERRL.  EOMI. No nystagmus.  Ears and nose without any masses or lesions.  Mouth is pink and moist.  Neck in c-collar. Heart: regular, rate, and rhythm.  Normal s1,s2. No obvious murmurs, gallops, or rubs noted.  Palpable radial and pedal pulses bilaterally Lungs: CTAB, no wheezes, rhonchi, or rales noted.  Respiratory effort nonlabored Abd: soft, NT, ND, +BS, no masses, hernias, or organomegaly MS: all 4  extremities are symmetrical with no cyanosis, clubbing, or edema. Skin: warm and dry with no masses, lesions, or rashes Neuro: see neuro note for full details exam, but exam  reveals, weakness on left and right side of his body.  He is able to move his LLE from his calf to his foot, but weak.  He is unable to lift, but he volitionally returns his left back to the stretcher.  He does fire his quadriceps muscle when trying to lift this leg.  He gives no effort on the right side.  He states touch whether sharp or light feels like "tickles."  again similar in upper extremities with no attempt at grip on right, mild on left.  Won't raise left arm, but will attempt lift on the right.  Psych: A&Ox3 but keeps perseverating on a cat after someone mentioned a CAT scan.  Has to be redirected frequently.  Requiring multiple questions for him to answer.   Results for orders placed or performed during the hospital encounter of 06/01/23 (from the past 48 hour(s))  CBG monitoring, ED     Status: None   Collection Time: 06/01/23 10:59 AM  Result Value Ref Range   Glucose-Capillary 83 70 - 99 mg/dL    Comment: Glucose reference range applies only to samples taken after fasting for at least 8 hours.  I-Stat Lactic Acid, ED     Status:  None   Collection Time: 06/01/23 11:21 AM  Result Value Ref Range   Lactic Acid, Venous 1.9 0.5 - 1.9 mmol/L  I-Stat Chem 8, ED     Status: Abnormal   Collection Time: 06/01/23 11:23 AM  Result Value Ref Range   Sodium 137 135 - 145 mmol/L   Potassium 3.4 (L) 3.5 - 5.1 mmol/L   Chloride 104 98 - 111 mmol/L   BUN 13 6 - 20 mg/dL   Creatinine, Ser 3.24 (L) 0.61 - 1.24 mg/dL   Glucose, Bld 93 70 - 99 mg/dL    Comment: Glucose reference range applies only to samples taken after fasting for at least 8 hours.   Calcium, Ion 1.05 (L) 1.15 - 1.40 mmol/L   TCO2 19 (L) 22 - 32 mmol/L   Hemoglobin 15.3 13.0 - 17.0 g/dL   HCT 40.1 02.7 - 25.3 %   CT C-SPINE NO CHARGE  Result Date:  06/01/2023 CLINICAL DATA:  27 year old male status post level 1 trauma. Fell down stairs, subsequent unilateral weakness and neurologic deficits. EXAM: CT CERVICAL SPINE WITH CONTRAST TECHNIQUE: Multiplanar CT images of the cervical spine were reconstructed from contemporary CTA of the Neck. RADIATION DOSE REDUCTION: This exam was performed according to the departmental dose-optimization program which includes automated exposure control, adjustment of the mA and/or kV according to patient size and/or use of iterative reconstruction technique. CONTRAST:  None or No additional COMPARISON:  CT head, CTA head and neck reported separately. Previous cervical spine CT 12/16/2020. FINDINGS: Alignment: Preserved cervical lordosis. Cervicothoracic junction alignment is within normal limits. Bilateral posterior element alignment is within normal limits. Skull base and vertebrae: Bone mineralization is within normal limits. Visualized skull base is intact. No atlanto-occipital dissociation. C1 and C2 appear intact and aligned. Mild motion artifact. No acute osseous abnormality identified. Soft tissues and spinal canal: No prevertebral fluid or swelling. No visible canal hematoma. Neck CTA today is reported separately. Disc levels:  Negative. Upper chest: Chest CT is reported separately. Other: Carious left maxillary anterior molar incidentally noted. Preliminary results of the above discussed with Dr. Leonard Schwartz. Thompson on 06/01/2023 at 10:58 . IMPRESSION: 1. Mild motion artifact. No acute traumatic injury identified in the cervical spine. 2. CTA Head And Neck reported separately. 3. Carious left maxillary anterior molar. Electronically Signed   By: Odessa Fleming M.D.   On: 06/01/2023 11:17   CT ANGIO HEAD NECK W WO CM  Result Date: 06/01/2023 CLINICAL DATA:  27 year old male status post level 1 trauma. Fell down stairs, subsequent unilateral weakness and neurologic deficits. EXAM: CT ANGIOGRAPHY HEAD AND NECK WITH AND WITHOUT CONTRAST  TECHNIQUE: Multidetector CT imaging of the head and neck was performed using the standard protocol during bolus administration of intravenous contrast. Multiplanar CT image reconstructions and MIPs were obtained to evaluate the vascular anatomy. Carotid stenosis measurements (when applicable) are obtained utilizing NASCET criteria, using the distal internal carotid diameter as the denominator. RADIATION DOSE REDUCTION: This exam was performed according to the departmental dose-optimization program which includes automated exposure control, adjustment of the mA and/or kV according to patient size and/or use of iterative reconstruction technique. CONTRAST:  75mL OMNIPAQUE IOHEXOL 350 MG/ML SOLN COMPARISON:  Cervical spine and chest CT today reported separately. Prior head CT 12/16/2020. FINDINGS: CT HEAD Brain: Normal cerebral volume. No midline shift, ventriculomegaly, mass effect, evidence of mass lesion, intracranial hemorrhage or evidence of cortically based acute infarction. Gray-white matter differentiation is within normal limits throughout the brain. Calvarium and skull base: No  fracture identified. Paranasal sinuses: Visualized paranasal sinuses and mastoids are stable and well aerated. Orbits: Visualized orbits and scalp soft tissues are within normal limits. CTA NECK Skeleton: Cervical spine is detailed separately. Upper chest: Negative aside from mild motion artifact, Chest CT today is reported separately. Other neck: Mild motion artifact. Thyroid, larynx, pharynx, parapharyngeal spaces, retropharyngeal space, sublingual space, submandibular spaces, masticator and parotid spaces are within normal limits. No posttraumatic soft tissue gas identified. Aortic arch: Mild cardiac pulsation.  Normal 3 vessel arch. Right carotid system: Normal allowing for mild motion artifact. Left carotid system: Normal. Vertebral arteries: Proximal right subclavian artery and right vertebral artery origin are normal. Right  vertebral is patent and within normal limits to the skull base. Proximal left subclavian artery and left vertebral artery origin are normal. Left vertebral artery is slightly dominant, patent and within normal limits to the skull base. CTA HEAD Posterior circulation: Patent distal vertebral arteries and vertebrobasilar junction, left V4 segment is dominant. Both PICA origins are patent, left PICA origin may be fenestrated (normal variant). Patent basilar artery without stenosis. Patent SCA and PCA origins. Posterior communicating arteries are diminutive or absent. Left PCA bifurcates early. Bilateral PCA branches are within normal limits. Anterior circulation: Both ICA siphons appear patent and normal. Normal left posterior communicating artery origin. Patent carotid termini. Normal MCA and ACA origins. Diminutive or absent anterior communicating artery. Bilateral ACA branches are within normal limits. Left MCA M1 segment and bifurcation are patent without stenosis. Right MCA M1 segment and bifurcation are patent without stenosis. Bilateral MCA branches are within normal limits. Venous sinuses: Patent. Anatomic variants: Mildly dominant left vertebral artery. Early branching of the left PCA. Possible fenestration of the left PICA origin. Review of the MIP images confirms the above findings And preliminary results of the above discussed with Dr. Leonard Schwartz. Thompson on 06/01/2023 at 10:58 . IMPRESSION: 1. Normal CTA Head and Neck.  No acute traumatic injury identified. 2. Normal CT appearance of the brain. 3. Cervical spine and Chest CT today are reported separately. Electronically Signed   By: Odessa Fleming M.D.   On: 06/01/2023 11:12   CT CHEST ABDOMEN PELVIS W CONTRAST  Addendum Date: 06/01/2023   ADDENDUM REPORT: 06/01/2023 11:10 ADDENDUM: Contrast volume missing from original report: 75 cc Omnipaque 350 intravenously. Electronically Signed   By: Carey Bullocks M.D.   On: 06/01/2023 11:10   Result Date:  06/01/2023 CLINICAL DATA:  Status post fall.  Poly trauma.  Trauma 1 patient. EXAM: CT CHEST, ABDOMEN, AND PELVIS WITH CONTRAST TECHNIQUE: Multidetector CT imaging of the chest, abdomen and pelvis was performed following the standard protocol during bolus administration of intravenous contrast. RADIATION DOSE REDUCTION: This exam was performed according to the departmental dose-optimization program which includes automated exposure control, adjustment of the mA and/or kV according to patient size and/or use of iterative reconstruction technique. COMPARISON:  Chest radiograph same date. Abdominopelvic CT 07/11/2016. FINDINGS: CT CHEST FINDINGS Cardiovascular: No evidence of acute vascular injury or mediastinal hematoma. The thoracic aorta and great vessels appear normal. The heart size is normal. There is no pericardial effusion. Mediastinum/Nodes: There are no enlarged mediastinal, hilar or axillary lymph nodes. Small amount of residual thymic tissue in the anterior mediastinum. The thyroid gland, trachea and esophagus demonstrate no significant findings. Lungs/Pleura: No pleural effusion or pneumothorax. The lungs are clear. Musculoskeletal/Chest wall: No evidence of acute fracture or chest wall hematoma. Spine findings are dictated separately. CT ABDOMEN AND PELVIS FINDINGS Hepatobiliary: No evidence of acute hepatic  injury. There is focal fat adjacent to the falciform ligament. No evidence of gallstones, gallbladder wall thickening or biliary dilatation. Pancreas: Unremarkable. No pancreatic ductal dilatation or surrounding inflammatory changes. Spleen: No evidence of acute splenic injury. The spleen appears normal. Adrenals/Urinary Tract: Both adrenal glands appear normal. No evidence of urinary tract calculus, suspicious renal lesion or hydronephrosis. The bladder appears normal for its degree of distention. Stomach/Bowel: No enteric contrast administered. The stomach appears unremarkable for its degree of  distension. No evidence of bowel wall thickening, distention or surrounding inflammatory change. No evidence of bowel or mesenteric injury. The appendix appears normal. Vascular/Lymphatic: There are no enlarged abdominal or pelvic lymph nodes. No evidence of acute vascular injury or retroperitoneal hematoma. Reproductive: The prostate gland and seminal vesicles appear normal. Other: Intact abdominal wall. No evidence of hemoperitoneum or pneumoperitoneum. Musculoskeletal: No acute or significant osseous findings. Spine details are dictated separately. IMPRESSION: 1. No evidence of acute injury within the chest, abdomen or pelvis. 2. Spine details dictated separately. 3. These results were reviewed in person shortly after its performance on 06/01/2023 at 10:50 am with Dr. Violeta Gelinas and the trauma team. Electronically Signed: By: Carey Bullocks M.D. On: 06/01/2023 11:02   CT L-SPINE NO CHARGE  Result Date: 06/01/2023 CLINICAL DATA:  Trauma 1 patient.  Fall.  Back pain. EXAM: CT Thoracic and Lumbar spine without contrast TECHNIQUE: Multiplanar CT images of the thoracic and lumbar spine were reconstructed from contemporary CT of the Chest, Abdomen, and Pelvis. RADIATION DOSE REDUCTION: This exam was performed according to the departmental dose-optimization program which includes automated exposure control, adjustment of the mA and/or kV according to patient size and/or use of iterative reconstruction technique. CONTRAST:  No additional COMPARISON:  Abdominopelvic CT 07/11/2016. FINDINGS: CT THORACIC SPINE FINDINGS Alignment: Normal. Vertebrae: No evidence of acute fracture or traumatic subluxation. Scattered endplate irregularity in the lower thoracic spine, likely developmental. Paraspinal and other soft tissues: No acute paraspinal findings. Disc levels: No evidence of large disc herniation, spinal stenosis or nerve root encroachment. CT LUMBAR SPINE FINDINGS Segmentation: There are 5 lumbar type vertebral  bodies. Alignment: Normal. Vertebrae: No evidence of acute fracture or traumatic subluxation. No evidence of pars defect. Paraspinal and other soft tissues: Unremarkable. Disc levels: No significant spondylosis. No disc herniation, spinal stenosis or nerve root encroachment. IMPRESSION: 1. No evidence of acute fracture or traumatic subluxation of the thoracolumbar spine. 2. No significant spondylosis. 3. No evidence of large disc herniation, spinal stenosis or nerve root encroachment. Electronically Signed   By: Carey Bullocks M.D.   On: 06/01/2023 11:09   CT T-SPINE NO CHARGE  Result Date: 06/01/2023 CLINICAL DATA:  Trauma 1 patient.  Fall.  Back pain. EXAM: CT Thoracic and Lumbar spine without contrast TECHNIQUE: Multiplanar CT images of the thoracic and lumbar spine were reconstructed from contemporary CT of the Chest, Abdomen, and Pelvis. RADIATION DOSE REDUCTION: This exam was performed according to the departmental dose-optimization program which includes automated exposure control, adjustment of the mA and/or kV according to patient size and/or use of iterative reconstruction technique. CONTRAST:  No additional COMPARISON:  Abdominopelvic CT 07/11/2016. FINDINGS: CT THORACIC SPINE FINDINGS Alignment: Normal. Vertebrae: No evidence of acute fracture or traumatic subluxation. Scattered endplate irregularity in the lower thoracic spine, likely developmental. Paraspinal and other soft tissues: No acute paraspinal findings. Disc levels: No evidence of large disc herniation, spinal stenosis or nerve root encroachment. CT LUMBAR SPINE FINDINGS Segmentation: There are 5 lumbar type vertebral bodies. Alignment: Normal. Vertebrae:  No evidence of acute fracture or traumatic subluxation. No evidence of pars defect. Paraspinal and other soft tissues: Unremarkable. Disc levels: No significant spondylosis. No disc herniation, spinal stenosis or nerve root encroachment. IMPRESSION: 1. No evidence of acute fracture or  traumatic subluxation of the thoracolumbar spine. 2. No significant spondylosis. 3. No evidence of large disc herniation, spinal stenosis or nerve root encroachment. Electronically Signed   By: Carey Bullocks M.D.   On: 06/01/2023 11:09   DG Chest Port 1 View  Result Date: 06/01/2023 CLINICAL DATA:  Level 1 trauma patient. EXAM: PORTABLE CHEST 1 VIEW COMPARISON:  Prior radiographs 12/16/2020. FINDINGS: Portable chest: 1023 hours. The heart size and mediastinal contours are stable without evidence of mediastinal hematoma. The lungs are clear. There is no pleural effusion or pneumothorax. No acute fractures are identified. Telemetry leads overlie the chest. AP pelvis: The mineralization and alignment are normal. There is no evidence of acute fracture or dislocation. The hip and sacroiliac joint spaces are preserved. The soft tissues appear unremarkable. IMPRESSION: 1. No evidence of acute chest injury or active cardiopulmonary process. 2. No evidence of acute pelvic injury. Electronically Signed   By: Carey Bullocks M.D.   On: 06/01/2023 10:44   DG Pelvis Portable  Result Date: 06/01/2023 CLINICAL DATA:  Level 1 trauma patient. EXAM: PORTABLE CHEST 1 VIEW COMPARISON:  Prior radiographs 12/16/2020. FINDINGS: Portable chest: 1023 hours. The heart size and mediastinal contours are stable without evidence of mediastinal hematoma. The lungs are clear. There is no pleural effusion or pneumothorax. No acute fractures are identified. Telemetry leads overlie the chest. AP pelvis: The mineralization and alignment are normal. There is no evidence of acute fracture or dislocation. The hip and sacroiliac joint spaces are preserved. The soft tissues appear unremarkable. IMPRESSION: 1. No evidence of acute chest injury or active cardiopulmonary process. 2. No evidence of acute pelvic injury. Electronically Signed   By: Carey Bullocks M.D.   On: 06/01/2023 10:44      Assessment/Plan Fall down steps ?concussion  No  injuries have currently been identified.  He is in the MRI scanner of his brain to rule out CVA; however, this seems unlikely.  His neuro exam is not consistent with any specific deficits at this time.  Of note, in review of his chart, he has multiple ED visits at other institutions with falls and similar complaints of lower extremity weakness, paralysis, etc, with no findings to support these symptoms.  The patient will be admitted for observation and therapies and likely plan for DC home tomorrow if no other findings are noted on his MRI.  This case has been discussed with Dr. Jerrell Belfast of neurology as well as Dr. Dalene Seltzer, EDP.  FEN - regular diet VTE - lovenox ID - none currently needed Admit - observation, therapies, likely plan for DC home tomorrow  I reviewed nursing notes, ED provider notes, last 24 h vitals and pain scores, last 48 h intake and output, last 24 h labs and trends, and last 24 h imaging results.  Letha Cape, [facility] 06/01/2023, 11:33 AM Please see Amion for pager number during day hours 7:00am-4:30pm or 7:00am -11:30am on weekends

## 2023-06-01 NOTE — ED Notes (Signed)
Trauma Response Nurse Documentation   Carlos Cruz is a 27 y.o. male arriving to [facility] ED via [facility]  On No antithrombotic. Trauma was activated as a Level 2 by Consulting civil engineer, Upgraded by Dr. Dalene Seltzer based on the following trauma criteria Paralysis associated with trauma,.  Patient cleared for CT by Dr. Dalene Seltzer. Pt transported to CT with trauma response nurse present to monitor. RN remained with the patient throughout their absence from the department for clinical observation.   GCS 15.  History   Past Medical History:  Diagnosis Date   Asthma    Attention deficit      Past Surgical History:  Procedure Laterality Date   CYSTOSCOPY         Initial Focused Assessment (If applicable, or please see trauma documentation):  Airway - Clear Breathing - Unlabored Circulation - no bruising/bleeding noted, strong peripheral pulses GCS - 15  CT's Completed:   CT Head, CT C-Spine, CT Chest w/ contrast, CT abdomen/pelvis w/ contrast, and CT Angio Head and neck  Interventions:   Labs Xrays CT scans MRI  Neuro Consult Admit for obs  Plan for disposition:  Admission to floor   Event Summary:  TO ED via [facility] from an Urgent Care after "falling down stairs at work" was able to ambulate into urgent care, but became increasingly weak on left side with numbness and tingling-- and severe back pain while there. Decision to transfer to [place] for trauma--  On arrival, pt in C-Collar, states he is unable to move his left side, lets leg fall to bed, does not let hand fall onto face, will move left hand away from face when held above his head. Crying, stating "I am seeing double! I can't hear you! I can't see ." Does flinch when hand placed over eyes.   Decision to upgrade due to ongoing neuro symptoms, -- CODE STROKE also called per Dr. Dalene Seltzer.   Pt taken to CT and MRI - maintaining C-SPine precautions.     Bedside handoff with ED RN Marchelle Folks,  RN.    Lesle Chris Kerly Rigsbee  Trauma Response RN  Please call TRN at [phone] for further assistance.

## 2023-06-01 NOTE — Progress Notes (Signed)
Pt admitted from the ED for s/p fall down some stairs while at work.  He was transported via gurney accompanied by a transporter and Charity fundraiser. Assisted pt into his bed via lateral transfer. Reviewed POC with pt and introduced him to his surroundings. Obtained VS which were WNL. Answered any pending questions. Pt had no further questions. Instructed pt to utilize RN call light for assistance. Hourly rounds performed. Bed in lowest position, locked with two upper side rails engaged. Belongings and call light within reach

## 2023-06-01 NOTE — Consult Note (Signed)
Neurology Consultation  Reason for Consult: Left-sided weakness of sudden onset after fall Referring Physician: Dr. Dalene Seltzer  CC: Sudden onset left-sided weakness after fall  History is obtained from: Chart  HPI: Carlos Cruz is a 27 y.o. male unable to provide clear history at this time, presented to the emergency room after having a fall on multiple steps of stairs at work per report.  Code stroke was initiated after the EDP discussed the case with me-the patient had persistent left-sided weakness with a last known well somewhere around 7:10 AM.  He was at work, fell through at least 10 steps and hit his head on the ground. Has been complaining of headache, dizziness, memory loss and hearing loss since the fall. Speech is not garbled but he keeps asking for his cat and his wife.    LKW: 7:10 AM-confirmed by coworker IV thrombolysis given?: no, examination not consistent with stroke, MRI negative for stroke EVT: No-no ELVO Premorbid modified Rankin scale (mRS):0   ROS: Difficult to ascertain due to his mentation  Past Medical History:  Diagnosis Date   Asthma    Attention deficit      History reviewed. No pertinent family history.   Social History:   reports that he has never smoked. He has never used smokeless tobacco. He reports current alcohol use. He reports that he does not use drugs.  Medications  Current Facility-Administered Medications:    0.9 %  sodium chloride infusion, 250 mL, Intravenous, PRN, Simaan, Elizabeth S, PA-C   acetaminophen (TYLENOL) tablet 1,000 mg, 1,000 mg, Oral, Q6H PRN, Simaan, Elizabeth S, PA-C   docusate sodium (COLACE) capsule 100 mg, 100 mg, Oral, BID, Simaan, Elizabeth S, PA-C   [START ON 06/02/2023] enoxaparin (LOVENOX) injection 30 mg, 30 mg, Subcutaneous, Q12H, Simaan, Elizabeth S, PA-C   fentaNYL (SUBLIMAZE) injection 25 mcg, 25 mcg, Intravenous, Once, Alvira Monday, MD   hydrALAZINE (APRESOLINE) injection 10 mg, 10 mg,  Intravenous, Q2H PRN, Simaan, Elizabeth S, PA-C   ibuprofen (ADVIL) tablet 400-800 mg, 400-800 mg, Oral, Q6H PRN, Simaan, Elizabeth S, PA-C   LORazepam (ATIVAN) injection 0.5 mg, 0.5 mg, Intravenous, Once, Alvira Monday, MD   methocarbamol (ROBAXIN) tablet 500 mg, 500 mg, Oral, Q8H **OR** methocarbamol (ROBAXIN) 500 mg in dextrose 5 % 50 mL IVPB, 500 mg, Intravenous, Q8H, Simaan, Elizabeth S, PA-C, Last Rate: 110 mL/hr at 06/01/23 1206, 500 mg at 06/01/23 1206   metoprolol tartrate (LOPRESSOR) injection 5 mg, 5 mg, Intravenous, Q6H PRN, Simaan, Elizabeth S, PA-C   ondansetron (ZOFRAN-ODT) disintegrating tablet 4 mg, 4 mg, Oral, Q6H PRN **OR** ondansetron (ZOFRAN) injection 4 mg, 4 mg, Intravenous, Q6H PRN, Simaan, Elizabeth S, PA-C   polyethylene glycol (MIRALAX / GLYCOLAX) packet 17 g, 17 g, Oral, Daily PRN, Simaan, Elizabeth S, PA-C   sodium chloride flush (NS) 0.9 % injection 3 mL, 3 mL, Intravenous, Q12H, Simaan, Elizabeth S, PA-C, 3 mL at 06/01/23 1207   sodium chloride flush (NS) 0.9 % injection 3 mL, 3 mL, Intravenous, PRN, Adam Phenix, PA-C  Current Outpatient Medications:    acetaminophen (TYLENOL) 500 MG tablet, Take 500 mg by mouth every 6 (six) hours as needed for mild pain., Disp: , Rfl:    ibuprofen (ADVIL) 200 MG tablet, Take 200 mg by mouth every 6 (six) hours as needed for mild pain., Disp: , Rfl:    Multiple Vitamin (MULTI-DAY PO), Take 3 tablets by mouth daily. gummy (Patient not taking: Reported on 06/01/2023), Disp: , Rfl:    Exam:  Current vital signs: BP 118/82   Pulse 75   Temp (!) 97.5 F (36.4 C) (Oral)   Resp 17   SpO2 100%  Vital signs in last 24 hours: Temp:  [97.5 F (36.4 C)] 97.5 F (36.4 C) (07/24 1017) Pulse Rate:  [75] 75 (07/24 1017) Resp:  [17] 17 (07/24 1017) BP: (118)/(82) 118/82 (07/24 1017) SpO2:  [100 %] 100 % (07/24 1017) General: Awake alert HEENT: Neck in c-collar CVS: Regular rhythm Chest: Clear Abdomen nondistended  nontender Neurological exam He is awake alert oriented to self, the fact that he is in the hospital.  Does not remember why he got there or how he got here.  Remembers falling at work but does not remember what time. He was able to tell me the month but not his current age. Speech is clear No evidence of dysarthria Poor attention concentration Keeps repeating sentences Cranial nerves II to XII: Pupils equal round reactive light, gaze is somewhat upwardly facing but it seems volitional.  Blinks to threat from both sides.  Face appears symmetric. Motor examination: Nearly flaccid left upper extremity and arm passively lifting the left arm up, it never falls on his face and always misses the face.  On few occasions he was able to move that left arm without instruction.  Right arm is nearly full strength.  Left lower extremity examination is limited by severe pain and so is the right lower extremity but right lower extremity he is spontaneously able to raise while left he is not able to spontaneously raise.  Hoover sign positive on the left Sensation inconsistent examination. Coordination difficult to assess    NIHSS-10  Labs I have reviewed labs in epic and the results pertinent to this consultation are:  CBC    Component Value Date/Time   WBC 7.4 06/01/2023 1115   RBC 5.22 06/01/2023 1115   HGB 15.3 06/01/2023 1123   HCT 45.0 06/01/2023 1123   PLT 285 06/01/2023 1115   MCV 87.0 06/01/2023 1115   MCH 29.9 06/01/2023 1115   MCHC 34.4 06/01/2023 1115   RDW 12.1 06/01/2023 1115   LYMPHSABS 2.0 12/16/2020 2157   MONOABS 0.6 12/16/2020 2157   EOSABS 0.3 12/16/2020 2157   BASOSABS 0.0 12/16/2020 2157    CMP     Component Value Date/Time   NA 137 06/01/2023 1123   K 3.4 (L) 06/01/2023 1123   CL 104 06/01/2023 1123   CO2 19 (L) 06/01/2023 1115   GLUCOSE 93 06/01/2023 1123   BUN 13 06/01/2023 1123   CREATININE 0.60 (L) 06/01/2023 1123   CALCIUM 8.8 (L) 06/01/2023 1115   PROT 6.1  (L) 06/01/2023 1115   ALBUMIN 3.8 06/01/2023 1115   AST 50 (H) 06/01/2023 1115   ALT 110 (H) 06/01/2023 1115   ALKPHOS 58 06/01/2023 1115   BILITOT 0.6 06/01/2023 1115   GFRNONAA >60 06/01/2023 1115   GFRAA >60 12/06/2017 1838    Imaging I have reviewed the images obtained:  CT head no acute changes CT angio head and neck: No acute changes.  No evidence of C-spine fracture. Stat MRI of the brain: Negative for acute process  Assessment:  27 year old presented to the emergency room after having sustained a fall at work.  Code stroke was activated due to concern for focal left-sided weakness.  His exam is very inconsistent as he has volitional effort dependent weakness in the left arm, left leg as well as multiple other neurological symptoms that do not fit any  particular vascular territory. Due to multiple inconsistencies on exam, stat brain MRI was pursued to ensure that a stroke is not missed.    MRI brain was personally reviewed by me-no evidence of stroke.  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 Further management per primary team's of any other injury sustained. Inpatient neurology will be available as needed Plan was discussed with Dr. Dalene Seltzer and Dr. Janee Morn   -- Milon Dikes, MD Neurologist [facility] Pager: [phone]

## 2023-06-01 NOTE — Code Documentation (Signed)
Patient had a fall down stairs at work (he works Ha[employer], [employer]) at [employer]) and was taken to urgent care. Urgent care called EMS. Patient arrived to [facility] as a level II trauma. CT scans were completed. Pt was complaining of left-sided weakness/numbness, upward gaze, confusion. Neurology was consulted and code stroke was activated. SRN met patient and RN at bedside. Pt was taken to MRI to rule out stroke. MRI negative per MD. Trauma will admit for observation.   Modena Slater, RN  [phone]

## 2023-06-01 NOTE — ED Notes (Signed)
ED TO INPATIENT HANDOFF REPORT  ED Nurse Name and Phone #: Marchelle Folks RN  [phone]  S Name/Age/Gender Carlos Cruz 27 y.o. male Room/Bed: 019C/019C  Code Status   Code Status: Full Code  Home/SNF/Other Home Patient oriented to: self and place Is this baseline? No      Chief Complaint Fall down stairs [W10.8XXA]  Triage Note Pt complaing of blurry  and double vision pt came in fell at work pt went to urgent care and could no longer  move left side. Pt  RADIAL AND PEDAL PULSES INTACT     Allergies Allergies  Allergen Reactions   Ciprofloxacin Hives    Level of Care/Admitting Diagnosis ED Disposition     ED Disposition  Admit   Condition  --   Comment  Hospital Area: [facility] [[id number]]  Level of Care: Med-Surg [16]  May place patient in observation at [facility] or [facility] if equivalent level of care is available:: No  Covid Evaluation: Asymptomatic - no recent exposure (last 10 days) testing not required  Diagnosis: Fall down stairs [[id number]]  Admitting Physician: TRAUMA MD [[id number]]  Attending Physician: TRAUMA MD [[id number]]  For patients discharging to extended facilities (i.e. SNF, AL, group homes or LTAC) initiate:: Discharge to SNF/Facility Placement COVID-19 Lab Testing Protocol          B Medical/Surgery History Past Medical History:  Diagnosis Date   Asthma    Attention deficit    Past Surgical History:  Procedure Laterality Date   CYSTOSCOPY       A IV Location/Drains/Wounds Patient Lines/Drains/Airways Status     Active Line/Drains/Airways     Name Placement date Placement time Site Days   Peripheral IV 12/16/20 Left Antecubital 12/16/20  2154  Antecubital  897   Wound 02/10/13 Laceration Finger (Comment which one) Left shallow 1cm lacerastion to tip of finger 02/10/13  --  Finger (Comment which one)  3763            Intake/Output Last 24 hours No intake or output data in the 24 hours ending 06/01/23  1200  Labs/Imaging Results for orders placed or performed during the hospital encounter of 06/01/23 (from the past 48 hour(s))  CBG monitoring, ED     Status: None   Collection Time: 06/01/23 10:59 AM  Result Value Ref Range   Glucose-Capillary 83 70 - 99 mg/dL    Comment: Glucose reference range applies only to samples taken after fasting for at least 8 hours.  CBC     Status: None   Collection Time: 06/01/23 11:15 AM  Result Value Ref Range   WBC 7.4 4.0 - 10.5 K/uL   RBC 5.22 4.22 - 5.81 MIL/uL   Hemoglobin 15.6 13.0 - 17.0 g/dL   HCT 11.9 14.7 - 82.9 %   MCV 87.0 80.0 - 100.0 fL   MCH 29.9 26.0 - 34.0 pg   MCHC 34.4 30.0 - 36.0 g/dL   RDW 56.2 13.0 - 86.5 %   Platelets 285 150 - 400 K/uL   nRBC 0.0 0.0 - 0.2 %    Comment: Performed at [facility], [address], [place], [place] [address]  Protime-INR     Status: None   Collection Time: 06/01/23 11:15 AM  Result Value Ref Range   Prothrombin Time 12.9 11.4 - 15.2 seconds   INR 1.0 0.8 - 1.2    Comment: (NOTE) INR goal varies based on device and disease states. Performed at [facility]  Lab, [address], [place], [place] [address]   I-Stat Lactic Acid, ED     Status: None   Collection Time: 06/01/23 11:21 AM  Result Value Ref Range   Lactic Acid, Venous 1.9 0.5 - 1.9 mmol/L  I-Stat Chem 8, ED     Status: Abnormal   Collection Time: 06/01/23 11:23 AM  Result Value Ref Range   Sodium 137 135 - 145 mmol/L   Potassium 3.4 (L) 3.5 - 5.1 mmol/L   Chloride 104 98 - 111 mmol/L   BUN 13 6 - 20 mg/dL   Creatinine, Ser 3.08 (L) 0.61 - 1.24 mg/dL   Glucose, Bld 93 70 - 99 mg/dL    Comment: Glucose reference range applies only to samples taken after fasting for at least 8 hours.   Calcium, Ion 1.05 (L) 1.15 - 1.40 mmol/L   TCO2 19 (L) 22 - 32 mmol/L   Hemoglobin 15.3 13.0 - 17.0 g/dL   HCT 65.7 84.6 - 96.2 %   CT C-SPINE NO CHARGE  Result Date: 06/01/2023 CLINICAL DATA:  27 year old male status post level 1  trauma. Fell down stairs, subsequent unilateral weakness and neurologic deficits. EXAM: CT CERVICAL SPINE WITH CONTRAST TECHNIQUE: Multiplanar CT images of the cervical spine were reconstructed from contemporary CTA of the Neck. RADIATION DOSE REDUCTION: This exam was performed according to the departmental dose-optimization program which includes automated exposure control, adjustment of the mA and/or kV according to patient size and/or use of iterative reconstruction technique. CONTRAST:  None or No additional COMPARISON:  CT head, CTA head and neck reported separately. Previous cervical spine CT 12/16/2020. FINDINGS: Alignment: Preserved cervical lordosis. Cervicothoracic junction alignment is within normal limits. Bilateral posterior element alignment is within normal limits. Skull base and vertebrae: Bone mineralization is within normal limits. Visualized skull base is intact. No atlanto-occipital dissociation. C1 and C2 appear intact and aligned. Mild motion artifact. No acute osseous abnormality identified. Soft tissues and spinal canal: No prevertebral fluid or swelling. No visible canal hematoma. Neck CTA today is reported separately. Disc levels:  Negative. Upper chest: Chest CT is reported separately. Other: Carious left maxillary anterior molar incidentally noted. Preliminary results of the above discussed with Dr. Leonard Schwartz. Thompson on 06/01/2023 at 10:58 . IMPRESSION: 1. Mild motion artifact. No acute traumatic injury identified in the cervical spine. 2. CTA Head And Neck reported separately. 3. Carious left maxillary anterior molar. Electronically Signed   By: Odessa Fleming M.D.   On: 06/01/2023 11:17   CT ANGIO HEAD NECK W WO CM  Result Date: 06/01/2023 CLINICAL DATA:  26 year old male status post level 1 trauma. Fell down stairs, subsequent unilateral weakness and neurologic deficits. EXAM: CT ANGIOGRAPHY HEAD AND NECK WITH AND WITHOUT CONTRAST TECHNIQUE: Multidetector CT imaging of the head and neck was  performed using the standard protocol during bolus administration of intravenous contrast. Multiplanar CT image reconstructions and MIPs were obtained to evaluate the vascular anatomy. Carotid stenosis measurements (when applicable) are obtained utilizing NASCET criteria, using the distal internal carotid diameter as the denominator. RADIATION DOSE REDUCTION: This exam was performed according to the departmental dose-optimization program which includes automated exposure control, adjustment of the mA and/or kV according to patient size and/or use of iterative reconstruction technique. CONTRAST:  75mL OMNIPAQUE IOHEXOL 350 MG/ML SOLN COMPARISON:  Cervical spine and chest CT today reported separately. Prior head CT 12/16/2020. FINDINGS: CT HEAD Brain: Normal cerebral volume. No midline shift, ventriculomegaly, mass effect, evidence of mass lesion, intracranial hemorrhage or evidence of  cortically based acute infarction. Gray-white matter differentiation is within normal limits throughout the brain. Calvarium and skull base: No fracture identified. Paranasal sinuses: Visualized paranasal sinuses and mastoids are stable and well aerated. Orbits: Visualized orbits and scalp soft tissues are within normal limits. CTA NECK Skeleton: Cervical spine is detailed separately. Upper chest: Negative aside from mild motion artifact, Chest CT today is reported separately. Other neck: Mild motion artifact. Thyroid, larynx, pharynx, parapharyngeal spaces, retropharyngeal space, sublingual space, submandibular spaces, masticator and parotid spaces are within normal limits. No posttraumatic soft tissue gas identified. Aortic arch: Mild cardiac pulsation.  Normal 3 vessel arch. Right carotid system: Normal allowing for mild motion artifact. Left carotid system: Normal. Vertebral arteries: Proximal right subclavian artery and right vertebral artery origin are normal. Right vertebral is patent and within normal limits to the skull base.  Proximal left subclavian artery and left vertebral artery origin are normal. Left vertebral artery is slightly dominant, patent and within normal limits to the skull base. CTA HEAD Posterior circulation: Patent distal vertebral arteries and vertebrobasilar junction, left V4 segment is dominant. Both PICA origins are patent, left PICA origin may be fenestrated (normal variant). Patent basilar artery without stenosis. Patent SCA and PCA origins. Posterior communicating arteries are diminutive or absent. Left PCA bifurcates early. Bilateral PCA branches are within normal limits. Anterior circulation: Both ICA siphons appear patent and normal. Normal left posterior communicating artery origin. Patent carotid termini. Normal MCA and ACA origins. Diminutive or absent anterior communicating artery. Bilateral ACA branches are within normal limits. Left MCA M1 segment and bifurcation are patent without stenosis. Right MCA M1 segment and bifurcation are patent without stenosis. Bilateral MCA branches are within normal limits. Venous sinuses: Patent. Anatomic variants: Mildly dominant left vertebral artery. Early branching of the left PCA. Possible fenestration of the left PICA origin. Review of the MIP images confirms the above findings And preliminary results of the above discussed with Dr. Leonard Schwartz. Thompson on 06/01/2023 at 10:58 . IMPRESSION: 1. Normal CTA Head and Neck.  No acute traumatic injury identified. 2. Normal CT appearance of the brain. 3. Cervical spine and Chest CT today are reported separately. Electronically Signed   By: Odessa Fleming M.D.   On: 06/01/2023 11:12   CT CHEST ABDOMEN PELVIS W CONTRAST  Addendum Date: 06/01/2023   ADDENDUM REPORT: 06/01/2023 11:10 ADDENDUM: Contrast volume missing from original report: 75 cc Omnipaque 350 intravenously. Electronically Signed   By: Carey Bullocks M.D.   On: 06/01/2023 11:10   Result Date: 06/01/2023 CLINICAL DATA:  Status post fall.  Poly trauma.  Trauma 1 patient.  EXAM: CT CHEST, ABDOMEN, AND PELVIS WITH CONTRAST TECHNIQUE: Multidetector CT imaging of the chest, abdomen and pelvis was performed following the standard protocol during bolus administration of intravenous contrast. RADIATION DOSE REDUCTION: This exam was performed according to the departmental dose-optimization program which includes automated exposure control, adjustment of the mA and/or kV according to patient size and/or use of iterative reconstruction technique. COMPARISON:  Chest radiograph same date. Abdominopelvic CT 07/11/2016. FINDINGS: CT CHEST FINDINGS Cardiovascular: No evidence of acute vascular injury or mediastinal hematoma. The thoracic aorta and great vessels appear normal. The heart size is normal. There is no pericardial effusion. Mediastinum/Nodes: There are no enlarged mediastinal, hilar or axillary lymph nodes. Small amount of residual thymic tissue in the anterior mediastinum. The thyroid gland, trachea and esophagus demonstrate no significant findings. Lungs/Pleura: No pleural effusion or pneumothorax. The lungs are clear. Musculoskeletal/Chest wall: No evidence of acute fracture or  chest wall hematoma. Spine findings are dictated separately. CT ABDOMEN AND PELVIS FINDINGS Hepatobiliary: No evidence of acute hepatic injury. There is focal fat adjacent to the falciform ligament. No evidence of gallstones, gallbladder wall thickening or biliary dilatation. Pancreas: Unremarkable. No pancreatic ductal dilatation or surrounding inflammatory changes. Spleen: No evidence of acute splenic injury. The spleen appears normal. Adrenals/Urinary Tract: Both adrenal glands appear normal. No evidence of urinary tract calculus, suspicious renal lesion or hydronephrosis. The bladder appears normal for its degree of distention. Stomach/Bowel: No enteric contrast administered. The stomach appears unremarkable for its degree of distension. No evidence of bowel wall thickening, distention or surrounding  inflammatory change. No evidence of bowel or mesenteric injury. The appendix appears normal. Vascular/Lymphatic: There are no enlarged abdominal or pelvic lymph nodes. No evidence of acute vascular injury or retroperitoneal hematoma. Reproductive: The prostate gland and seminal vesicles appear normal. Other: Intact abdominal wall. No evidence of hemoperitoneum or pneumoperitoneum. Musculoskeletal: No acute or significant osseous findings. Spine details are dictated separately. IMPRESSION: 1. No evidence of acute injury within the chest, abdomen or pelvis. 2. Spine details dictated separately. 3. These results were reviewed in person shortly after its performance on 06/01/2023 at 10:50 am with Dr. Violeta Gelinas and the trauma team. Electronically Signed: By: Carey Bullocks M.D. On: 06/01/2023 11:02   CT L-SPINE NO CHARGE  Result Date: 06/01/2023 CLINICAL DATA:  Trauma 1 patient.  Fall.  Back pain. EXAM: CT Thoracic and Lumbar spine without contrast TECHNIQUE: Multiplanar CT images of the thoracic and lumbar spine were reconstructed from contemporary CT of the Chest, Abdomen, and Pelvis. RADIATION DOSE REDUCTION: This exam was performed according to the departmental dose-optimization program which includes automated exposure control, adjustment of the mA and/or kV according to patient size and/or use of iterative reconstruction technique. CONTRAST:  No additional COMPARISON:  Abdominopelvic CT 07/11/2016. FINDINGS: CT THORACIC SPINE FINDINGS Alignment: Normal. Vertebrae: No evidence of acute fracture or traumatic subluxation. Scattered endplate irregularity in the lower thoracic spine, likely developmental. Paraspinal and other soft tissues: No acute paraspinal findings. Disc levels: No evidence of large disc herniation, spinal stenosis or nerve root encroachment. CT LUMBAR SPINE FINDINGS Segmentation: There are 5 lumbar type vertebral bodies. Alignment: Normal. Vertebrae: No evidence of acute fracture or  traumatic subluxation. No evidence of pars defect. Paraspinal and other soft tissues: Unremarkable. Disc levels: No significant spondylosis. No disc herniation, spinal stenosis or nerve root encroachment. IMPRESSION: 1. No evidence of acute fracture or traumatic subluxation of the thoracolumbar spine. 2. No significant spondylosis. 3. No evidence of large disc herniation, spinal stenosis or nerve root encroachment. Electronically Signed   By: Carey Bullocks M.D.   On: 06/01/2023 11:09   CT T-SPINE NO CHARGE  Result Date: 06/01/2023 CLINICAL DATA:  Trauma 1 patient.  Fall.  Back pain. EXAM: CT Thoracic and Lumbar spine without contrast TECHNIQUE: Multiplanar CT images of the thoracic and lumbar spine were reconstructed from contemporary CT of the Chest, Abdomen, and Pelvis. RADIATION DOSE REDUCTION: This exam was performed according to the departmental dose-optimization program which includes automated exposure control, adjustment of the mA and/or kV according to patient size and/or use of iterative reconstruction technique. CONTRAST:  No additional COMPARISON:  Abdominopelvic CT 07/11/2016. FINDINGS: CT THORACIC SPINE FINDINGS Alignment: Normal. Vertebrae: No evidence of acute fracture or traumatic subluxation. Scattered endplate irregularity in the lower thoracic spine, likely developmental. Paraspinal and other soft tissues: No acute paraspinal findings. Disc levels: No evidence of large disc herniation, spinal stenosis  or nerve root encroachment. CT LUMBAR SPINE FINDINGS Segmentation: There are 5 lumbar type vertebral bodies. Alignment: Normal. Vertebrae: No evidence of acute fracture or traumatic subluxation. No evidence of pars defect. Paraspinal and other soft tissues: Unremarkable. Disc levels: No significant spondylosis. No disc herniation, spinal stenosis or nerve root encroachment. IMPRESSION: 1. No evidence of acute fracture or traumatic subluxation of the thoracolumbar spine. 2. No significant  spondylosis. 3. No evidence of large disc herniation, spinal stenosis or nerve root encroachment. Electronically Signed   By: Carey Bullocks M.D.   On: 06/01/2023 11:09   DG Chest Port 1 View  Result Date: 06/01/2023 CLINICAL DATA:  Level 1 trauma patient. EXAM: PORTABLE CHEST 1 VIEW COMPARISON:  Prior radiographs 12/16/2020. FINDINGS: Portable chest: 1023 hours. The heart size and mediastinal contours are stable without evidence of mediastinal hematoma. The lungs are clear. There is no pleural effusion or pneumothorax. No acute fractures are identified. Telemetry leads overlie the chest. AP pelvis: The mineralization and alignment are normal. There is no evidence of acute fracture or dislocation. The hip and sacroiliac joint spaces are preserved. The soft tissues appear unremarkable. IMPRESSION: 1. No evidence of acute chest injury or active cardiopulmonary process. 2. No evidence of acute pelvic injury. Electronically Signed   By: Carey Bullocks M.D.   On: 06/01/2023 10:44   DG Pelvis Portable  Result Date: 06/01/2023 CLINICAL DATA:  Level 1 trauma patient. EXAM: PORTABLE CHEST 1 VIEW COMPARISON:  Prior radiographs 12/16/2020. FINDINGS: Portable chest: 1023 hours. The heart size and mediastinal contours are stable without evidence of mediastinal hematoma. The lungs are clear. There is no pleural effusion or pneumothorax. No acute fractures are identified. Telemetry leads overlie the chest. AP pelvis: The mineralization and alignment are normal. There is no evidence of acute fracture or dislocation. The hip and sacroiliac joint spaces are preserved. The soft tissues appear unremarkable. IMPRESSION: 1. No evidence of acute chest injury or active cardiopulmonary process. 2. No evidence of acute pelvic injury. Electronically Signed   By: Carey Bullocks M.D.   On: 06/01/2023 10:44    Pending Labs Unresulted Labs (From admission, onward)     Start     Ordered   06/08/23 0500  Creatinine, serum   (enoxaparin (LOVENOX)    CrCl >/= 30 with major trauma, spinal cord injury, or selected orthopedic surgery)  Weekly,   R     Comments: while on enoxaparin therapy.    06/01/23 1120   06/02/23 0500  CBC  Tomorrow morning,   R        06/01/23 1120   06/02/23 0500  Basic metabolic panel  Tomorrow morning,   R        06/01/23 1120   06/01/23 1132  HIV Antibody (routine testing w rflx)  Once,   R        06/01/23 1132   06/01/23 1019  Comprehensive metabolic panel  [facility] ED TRAUMA PANEL [facility])  Once,   STAT        06/01/23 1019   06/01/23 1019  Ethanol  ([facility] ED TRAUMA PANEL [facility])  Once,   URGENT        06/01/23 1019   06/01/23 1019  Urinalysis, Routine w reflex microscopic -Urine, Clean Catch  [facility] ED TRAUMA PANEL [facility])  Once,   URGENT       Question:  Specimen Source  Answer:  Urine, Clean Catch   06/01/23 1019   06/01/23 1019  Sample to Blood Bank  ([facility]  ED TRAUMA PANEL [facility])  Once,   URGENT        06/01/23 1019            Vitals/Pain Today's Vitals   06/01/23 1017 06/01/23 1054  BP: 118/82   Pulse: 75   Resp: 17   Temp: (!) 97.5 F (36.4 C)   TempSrc: Oral   SpO2: 100%   PainSc:  10-Worst pain ever    Isolation Precautions No active isolations  Medications Medications  LORazepam (ATIVAN) injection 0.5 mg (has no administration in time range)  fentaNYL (SUBLIMAZE) injection 25 mcg (has no administration in time range)  acetaminophen (TYLENOL) tablet 1,000 mg (has no administration in time range)  methocarbamol (ROBAXIN) tablet 500 mg (has no administration in time range)    Or  methocarbamol (ROBAXIN) 500 mg in dextrose 5 % 50 mL IVPB (has no administration in time range)  docusate sodium (COLACE) capsule 100 mg (has no administration in time range)  polyethylene glycol (MIRALAX / GLYCOLAX) packet 17 g (has no administration in time range)  ondansetron (ZOFRAN-ODT) disintegrating tablet 4 mg (has no administration in time range)    Or  ondansetron (ZOFRAN) injection  4 mg (has no administration in time range)  metoprolol tartrate (LOPRESSOR) injection 5 mg (has no administration in time range)  hydrALAZINE (APRESOLINE) injection 10 mg (has no administration in time range)  sodium chloride flush (NS) 0.9 % injection 3 mL (has no administration in time range)  sodium chloride flush (NS) 0.9 % injection 3 mL (has no administration in time range)  0.9 %  sodium chloride infusion (has no administration in time range)  ibuprofen (ADVIL) tablet 400-800 mg (has no administration in time range)  enoxaparin (LOVENOX) injection 30 mg (has no administration in time range)  iohexol (OMNIPAQUE) 350 MG/ML injection 75 mL (75 mLs Intravenous Contrast Given 06/01/23 1105)    Mobility walks     Focused Assessments Neuro Assessment Handoff:  Swallow screen pass?  N/a Cardiac Rhythm: Normal sinus rhythm NIH Stroke Scale  Dizziness Present: Yes Headache Present: Yes Interval: Initial Level of Consciousness (1a.)   : Alert, keenly responsive LOC Questions (1b. )   : Answers neither question correctly LOC Commands (1c. )   : Performs both tasks correctly Best Gaze (2. )  : Partial gaze palsy Visual (3. )  : No visual loss Facial Palsy (4. )    : Normal symmetrical movements Motor Arm, Left (5a. )   : Some effort against gravity Motor Arm, Right (5b. ) : No drift Motor Leg, Left (6a. )  : Some effort against gravity Motor Leg, Right (6b. ) : Some effort against gravity Limb Ataxia (7. ): Absent Sensory (8. )  : Mild-to-moderate sensory loss, patient feels pinprick is less sharp or is dull on the affected side, or there is a loss of superficial pain with pinprick, but patient is aware of being touched Best Language (9. )  : No aphasia Dysarthria (10. ): Normal Extinction/Inattention (11.)   : No Abnormality Complete NIHSS TOTAL: 10 Last date known well: 06/01/23 Last time known well: 0710 Neuro Assessment: Exceptions to WDL Neuro Checks:   Initial (06/01/23  1130)  Has TPA been given? No If patient is a Neuro Trauma and patient is going to OR before floor call report to 4N Charge nurse: [phone] or [phone]   R Recommendations: See Admitting Provider Note  Report given to:   Additional Notes: pt arrives from urgent care via ems pt  reports headache . Pt reports having trouble remembering.

## 2023-06-01 NOTE — Evaluation (Signed)
Speech Language Pathology Evaluation Patient Details Name: Carlos Cruz MRN: [id number] DOB: 11-12-1995 Today's Date: 06/01/2023 Time: 7829-5621 SLP Time Calculation (min) (ACUTE ONLY): 18 min  Problem List:  Patient Active Problem List   Diagnosis Date Noted   Fall down stairs 06/01/2023   Past Medical History:  Past Medical History:  Diagnosis Date   Asthma    Attention deficit    Past Surgical History:  Past Surgical History:  Procedure Laterality Date   CYSTOSCOPY     HPI:  Pt is a 27 yo male presenting to ED via EMS 7/24 after presenting to urgent care with BLE weakness after a fall down the stairs. MRI Brain negative. No pertinent PMH.   Assessment / Plan / Recommendation Clinical Impression  Pt reports living at home with his wife, Carlos Cruz, and three kids where he handles all finances independently. 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 Prior to this job, he reports he was unemployed. He states he immediately noticed changes in his memory after falling. His wife, kids, and a friend were in the room upon SLP arrival and pt had difficulty recalling their name and relation to him when prompted. Pt scored a 4/30 on the SLUMS (27 and higher considered WFL) with deficits in orientation, memory, attention, and problem solving. Pt initially had difficulty with storage of five words for a delayed recall activity, requiring multiple repetitions and then appeared to be unable to recall the initial task itself. However, he then correctly recalled 3/5 words given verbal cues. Sustained attention appears to be significantly impaired evidenced by difficulties storage and recall of new information as well as impaired auditory comprehension, although note pt frequently looking down at his phone throughout evaluation. Throughout the evaluation, pt provided contradicting information regarding his baseline and current  performance. SLP provided education regarding importance of increased assistance after d/c as well as a referral for OP ST. Will continue to f/u as able for functional cognitive activities.    SLP Assessment  SLP Recommendation/Assessment: Patient needs continued Speech Lanaguage Pathology Services SLP Visit Diagnosis: Cognitive communication deficit (R41.841)    Recommendations for follow up therapy are one component of a multi-disciplinary discharge planning process, led by the attending physician.  Recommendations may be updated based on patient status, additional functional criteria and insurance authorization.    Follow Up Recommendations  Outpatient SLP    Assistance Recommended at Discharge  Frequent or constant Supervision/Assistance  Functional Status Assessment Patient has had a recent decline in their functional status and demonstrates the ability to make significant improvements in function in a reasonable and predictable amount of time.  Frequency and Duration min 2x/week  1 week      SLP Evaluation Cognition  Overall Cognitive Status: Impaired/Different from baseline Arousal/Alertness: Awake/alert Orientation Level: Oriented to person;Oriented to place;Disoriented to time;Disoriented to situation Year: Other (Comment) (1995) Day of Week: Incorrect Attention: Sustained Sustained Attention: Impaired Sustained Attention Impairment: Verbal basic Memory: Impaired Memory Impairment: Storage deficit;Retrieval deficit;Decreased recall of new information Awareness: Impaired Awareness Impairment: Intellectual impairment Problem Solving: Impaired Problem Solving Impairment: Verbal basic Executive Function: Self Monitoring;Self Correcting Self Monitoring: Impaired Self Monitoring Impairment: Verbal basic Self Correcting: Impaired Self Correcting Impairment: Verbal basic       Comprehension  Auditory Comprehension Overall Auditory Comprehension: Appears within functional  limits for tasks assessed    Expression Verbal Expression Overall Verbal Expression: Appears within functional limits for tasks assessed  Oral / Motor  Oral Motor/Sensory Function Overall Oral Motor/Sensory Function: Within functional limits Motor Speech Overall Motor Speech: Appears within functional limits for tasks assessed            Gwynneth Aliment, M.A., CF-SLP Speech Language Pathology, Acute Rehabilitation Services  Secure Chat preferred [phone]  06/01/2023, 3:16 PM

## 2023-06-01 NOTE — ED Notes (Signed)
Delay in transport due to provider and pharmacy tech in room.

## 2023-06-01 NOTE — ED Triage Notes (Signed)
Pt complaing of blurry  and double vision pt came in fell at work pt went to urgent care and could no longer  move left side. Pt  [facility] PULSES INTACT

## 2023-06-01 NOTE — TOC CAGE-AID Note (Signed)
Transition of Care [facility]) - CAGE-AID Screening   Patient Details  Name: Carlos Cruz MRN: [id number]  Hewitt Shorts, RN Trauma Response Nurse Phone Number: [phone] 06/01/2023, 4:53 PM      CAGE-AID Screening:    Have You Ever Felt You Ought to Cut Down on Your Drinking or Drug Use?: No Have People Annoyed You By Critizing Your Drinking Or Drug Use?: No Have You Felt Bad Or Guilty About Your Drinking Or Drug Use?: No Have You Ever Had a Drink or Used Drugs First Thing In The Morning to Steady Your Nerves or to Get Rid of a Hangover?: No CAGE-AID Score: 0  Substance Abuse Education Offered: No (denies alcohol or drug use)

## 2023-06-01 NOTE — Evaluation (Signed)
Occupational Therapy Evaluation Patient Details Name: Carlos Cruz MRN: [id number] DOB: Jun 14, 1996 Today's Date: 06/01/2023   History of Present Illness 27 yo male s/p fall down stairs with L side weakness, diplopia, bad HA, severe back pain and face numbness  PMH none   Clinical Impression   PT admitted with s/p fall with visual changes. Pt currently with functional limitiations due to the deficits listed below (see OT problem list). 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 Pt demonstrates pain in his L side HA and L side weakness. Pt with cognitive deficits. Pt able to name wife kids and friend in room. Pt provided all home information without errors. Pt reports prior spinal issue with L side weakness in feb 2024 at [facility] with lumbar puncture completed. Pt reports he stayed in hospital for 1 week for treatment and concern of bulging disc and spinal fluid.  Pt will benefit from skilled OT to increase their independence and safety with adls and balance to allow discharge Patient will benefit from intensive inpatient follow up therapy, >3 hours/day .       Recommendations for follow up therapy are one component of a multi-disciplinary discharge planning process, led by the attending physician.  Recommendations may be updated based on patient status, additional functional criteria and insurance authorization.   Assistance Recommended at Discharge Set up Supervision/Assistance  Patient can return home with the following Two people to help with walking and/or transfers;A lot of help with bathing/dressing/bathroom    Functional Status Assessment  Patient has had a recent decline in their functional status and demonstrates the ability to make significant improvements in function in a reasonable and predictable amount of time.  Equipment Recommendations  BSC/3in1;Wheelchair (measurements OT);Wheelchair cushion (measurements OT);Other (comment) (urinal)     Recommendations for Other Services Rehab consult (no insurance, wife concerned about taking him home. pt baseline walks without DME)     Precautions / Restrictions Precautions Precautions: Fall Required Braces or Orthoses: Other Brace (ok to D/C C-collar per Dr. Freida Busman) Restrictions Weight Bearing Restrictions: No      Mobility Bed Mobility Overal bed mobility: Needs Assistance Bed Mobility: Rolling, Sidelying to Sit, Sit to Sidelying Rolling: Mod assist, +2 for physical assistance Sidelying to sit: Mod assist, +2 for physical assistance     Sit to sidelying: Min assist General bed mobility comments: pt needs (A) to been L LE knee flexion to roll toward R with pad used. pt with (A) to elevate trunk off bed surface and bring BIL LE off bed surface    Transfers Overall transfer level: Needs assistance Equipment used: 2 person hand held assist Transfers: Sit to/from Stand Sit to Stand: Mod assist, +2 physical assistance           General transfer comment: pt stands 3 times during session. with initial 2 trials pt tends to lean significantly to R side, requiring support of OT to prevent fall to R side. On 3rd attempt pt stands without touching LLE to ground, no R lateral lean noted.      Balance Overall balance assessment: Needs assistance Sitting-balance support: No upper extremity supported, Feet supported Sitting balance-Leahy Scale: Good     Standing balance support: Bilateral upper extremity supported, Reliant on assistive device for balance Standing balance-Leahy Scale: Poor Standing balance comment: modA x2  ADL either performed or assessed with clinical judgement   ADL Overall ADL's : Needs assistance/impaired Eating/Feeding: Modified independent;Bed level Eating/Feeding Details (indicate cue type and reason): eating lunch arriving Grooming: Wash/dry face;Modified independent;Bed level           Upper Body Dressing  : Minimal assistance;Bed level Upper Body Dressing Details (indicate cue type and reason): don gown Lower Body Dressing: Total assistance Lower Body Dressing Details (indicate cue type and reason): place on socks Toilet Transfer: +2 for physical assistance;Moderate assistance;+2 for safety/equipment Toilet Transfer Details (indicate cue type and reason): pt with leans R and L with cues to progress by therapist with transfer to simulate. pt with large weight shift to the R side and toe weight bearing on the L . when backing up pt dragging L LE backward           General ADL Comments: wife present encouraging patient and reports you can move it now so thats better. Children in room and oldest requesting to stay with patient. wife educated when she asked that children are not allowed to stay without an adult present     Vision Baseline Vision/History: 0 No visual deficits Ability to See in Adequate Light: 1 Impaired Patient Visual Report: Diplopia;Blurring of vision Vision Assessment?: Yes Eye Alignment: Within Functional Limits Ocular Range of Motion: Within Functional Limits Visual Fields: Impaired-to be further tested in functional context Additional Comments: pt with R eye occluded reports that L eye Right visual fields superior / inferior have the greatest change in visual acuity. pt with L eye occluded reports R eye is "normal"     Perception     Praxis      Pertinent Vitals/Pain Pain Assessment Pain Assessment: Faces Faces Pain Scale: Hurts whole lot Pain Location: low back and LLE Pain Descriptors / Indicators: Burning, Tingling Pain Intervention(s): Limited activity within patient's tolerance, Monitored during session, Repositioned     Hand Dominance Right   Extremity/Trunk Assessment Upper Extremity Assessment Upper Extremity Assessment: Overall WFL for tasks assessed   Lower Extremity Assessment Lower Extremity Assessment: Defer to PT evaluation LLE Deficits /  Details: pt unable to move toes at EOB but when don socks was able to move toes supine. pt unable to help with hip flexion or knee flexion supine in bed but at eob able to demonstrate. pt reports complete numbness from hip down and into the pelvis area and when standing putting pressure on foot pain in the sole of the foot only ( burning) LLE Sensation: decreased light touch;decreased proprioception   Cervical / Trunk Assessment Cervical / Trunk Assessment: Normal   Communication Communication Communication: No difficulties   Cognition Arousal/Alertness: Awake/alert Behavior During Therapy: Anxious Overall Cognitive Status: Impaired/Different from baseline                                 General Comments: pt reports he has a terrible back, with prior imaging suggesting multiple bulging discs. PT attempts to reassure pt that readings of imaging from this admission do not show any acute abnormalities, however pt perseverates on having multiple bulging discs, unable to be reassured. OT educated patient on no need for ccollar at this time and that pt is clear from the doctors to proceed. pt preseverates on "ive really messed up my back"     General Comments  VSS on Ra    Exercises     Shoulder Instructions  Home Living Family/patient expects to be discharged to:: Private residence Living Arrangements: Spouse/significant other;Children Available Help at Discharge: Family;Available 24 hours/day Type of Home: Mobile home Home Access: Ramped entrance     Home Layout: One level     Bathroom Shower/Tub: Producer, television/film/video: Standard     Home Equipment: None   Additional Comments: dog name Mia, x3 children elementary age  Lives With: Spouse;Son;Daughter    Prior Functioning/Environment Prior Level of Function : Independent/Modified Independent;Working/employed;Driving             Mobility Comments: pt just returned to work 2 days ago, had been  without a job due to back pain and a similar hospital admission at [employer] with spontaneous onset weakness ADLs Comments: indep drives        OT Problem List: Decreased strength;Decreased activity tolerance;Impaired balance (sitting and/or standing);Decreased cognition;Decreased safety awareness;Decreased knowledge of use of DME or AE;Decreased knowledge of precautions;Pain      OT Treatment/Interventions: Self-care/ADL training;Therapeutic exercise;Neuromuscular education;DME and/or AE instruction;Energy conservation;Manual therapy;Modalities;Therapeutic activities;Cognitive remediation/compensation;Patient/family education;Balance training    OT Goals(Current goals can be found in the care plan section) Acute Rehab OT Goals Patient Stated Goal: to get his back looked at more . pt is very concerned about his back OT Goal Formulation: With patient/family Time For Goal Achievement: 06/15/23 Potential to Achieve Goals: Good  OT Frequency: Min 1X/week    Co-evaluation PT/OT/SLP Co-Evaluation/Treatment: Yes Reason for Co-Treatment: Complexity of the patient's impairments (multi-system involvement);Necessary to address cognition/behavior during functional activity;For patient/therapist safety;To address functional/ADL transfers PT goals addressed during session: Mobility/safety with mobility;Balance;Strengthening/ROM OT goals addressed during session: ADL's and self-care;Strengthening/ROM      AM-PAC OT "6 Clicks" Daily Activity     Outcome Measure Help from another person eating meals?: None Help from another person taking care of personal grooming?: None Help from another person toileting, which includes using toliet, bedpan, or urinal?: A Little Help from another person bathing (including washing, rinsing, drying)?: A Lot Help from another person to put on and taking off regular upper body clothing?: None Help from another person to put on and taking off regular lower body clothing?: A  Lot 6 Click Score: 19   End of Session Equipment Utilized During Treatment: Gait belt Nurse Communication: Mobility status;Precautions;Need for lift equipment  Activity Tolerance: Patient limited by pain;Other (comment) (pt reports upon sitting immediate pain in his head increased) Patient left: in bed;with call bell/phone within reach;with bed alarm set;with family/visitor present  OT Visit Diagnosis: Unsteadiness on feet (R26.81);Muscle weakness (generalized) (M62.81)                Time: 0981-1914 OT Time Calculation (min): 30 min Charges:  OT General Charges $OT Visit: 1 Visit OT Evaluation $OT Eval Moderate Complexity: 1 Mod   Brynn, OTR/L  Acute Rehabilitation Services Office: [phone] .   Mateo Flow 06/01/2023, 3:36 PM

## 2023-06-01 NOTE — Evaluation (Signed)
Physical Therapy Evaluation Patient Details Name: Carlos Cruz MRN: [id number] DOB: 11-18-1995 Today's Date: 06/01/2023  History of Present Illness  27 yo male s/p fall down stairs with L side weakness, diplopia, bad HA, severe back pain and face numbness  PMH none  Clinical Impression  Pt presents to PT with deficits in strength, power, gait, balance, endurance, sensation. PT notes multiple inconsistencies and functional mobility during session, documented below in LLE assessment and gait details. Pt is at a high risk for falls at this time requiring physical assistance for all aspects of functional mobility. PT attempts to reassure the patient that imaging of spine was unremarkable to acute findings, pt perseverates on having a history of a back back with multiple bulging discs. PT will continue to follow during admission in an effort to improve mobility quality and to reduce falls risk. Pt is not mobilizing well enough to discharge home at this time, however inpatient placement may be difficult to come by due to lack of health insurance. PT is hopeful the pt will progress quickly with aggressive mobilization in the acute setting.      Assistance Recommended at Discharge Frequent or constant Supervision/Assistance  If plan is discharge home, recommend the following:  Can travel by private vehicle  Two people to help with walking and/or transfers;Two people to help with bathing/dressing/bathroom;Assistance with cooking/housework;Assist for transportation;Help with stairs or ramp for entrance        Equipment Recommendations Wheelchair (measurements PT);Wheelchair cushion (measurements PT);BSC/3in1;Rolling walker (2 wheels)  Recommendations for Other Services       Functional Status Assessment Patient has had a recent decline in their functional status and demonstrates the ability to make significant improvements in function in a reasonable and predictable amount of time.      Precautions / Restrictions Precautions Precautions: Fall Required Braces or Orthoses:  (ok to D/C C-collar per Dr. Freida Busman) Restrictions Weight Bearing Restrictions: No      Mobility  Bed Mobility Overal bed mobility: Needs Assistance Bed Mobility: Rolling, Sidelying to Sit, Sit to Sidelying Rolling: Mod assist, +2 for physical assistance Sidelying to sit: Mod assist, +2 for physical assistance     Sit to sidelying: Min assist      Transfers Overall transfer level: Needs assistance Equipment used: 2 person hand held assist Transfers: Sit to/from Stand Sit to Stand: Mod assist, +2 physical assistance           General transfer comment: pt stands 3 times during session. with initial 2 trials pt tends to lean significantly to R side, requiring support of OT to prevent fall to R side. On 3rd attempt pt stands without touching LLE to ground, no R lateral lean noted.    Ambulation/Gait Ambulation/Gait assistance: Max assist, +2 physical assistance Gait Distance (Feet): 3 Feet Assistive device: 2 person hand held assist Gait Pattern/deviations: Step-to pattern, Knees buckling, Decreased dorsiflexion - left Gait velocity: reduced Gait velocity interpretation: <1.31 ft/sec, indicative of household ambulator   General Gait Details: pt with L knee buckling with ambulation forward for 3 steps, requiring significant support to remain standing, when returning to bed pt does not appear to demonstrate L knee instability although still requiring support to a lesser extent. maintain standing.  Stairs            Wheelchair Mobility     Tilt Bed    Modified Rankin (Stroke Patients Only)       Balance Overall balance assessment: Needs assistance Sitting-balance support: No upper extremity supported, Feet  supported Sitting balance-Leahy Scale: Good     Standing balance support: Bilateral upper extremity supported, Reliant on assistive device for balance Standing  balance-Leahy Scale: Poor Standing balance comment: modA x2                             Pertinent Vitals/Pain Pain Assessment Pain Assessment: Faces Faces Pain Scale: Hurts whole lot Pain Location: low back and LLE Pain Descriptors / Indicators: Burning, Tingling Pain Intervention(s): Limited activity within patient's tolerance    Home Living Family/patient expects to be discharged to:: Private residence Living Arrangements: Spouse/significant other;Children Available Help at Discharge: Family;Available 24 hours/day Type of Home: Mobile home Home Access: Ramped entrance       Home Layout: One level Home Equipment: None      Prior Function Prior Level of Function : Independent/Modified Independent;Working/employed;Driving             Mobility Comments: pt just returned to work 2 days ago, had been without a job due to back pain and a similar hospital admission at [employer] with spontaneous onset weakness       Hand Dominance        Extremity/Trunk Assessment   Upper Extremity Assessment Upper Extremity Assessment: Defer to OT evaluation    Lower Extremity Assessment Lower Extremity Assessment: LLE deficits/detail LLE Deficits / Details: PT notes inconsistencies in patient presentation during session. Initially with formal assessment pt is unable to move LLE at all in supine, however after PT dons pt's sock pt is observed to actively dorsiflex and plantarflex through full ROM multiple times. With further attempts at formal assessment in sitting pt reports an inability to DF, flicker of PF noted, and antigravity knee extension noted. Pt later holds LLE in knee extension against gravity, tolerates full weightbearing through LLE when backward stepping, and initiates lift of LLE back onto bed when returning to supine without assistance. LLE Sensation: decreased light touch    Cervical / Trunk Assessment Cervical / Trunk Assessment: Normal  Communication    Communication: No difficulties  Cognition Arousal/Alertness: Awake/alert Behavior During Therapy: Anxious Overall Cognitive Status: Impaired/Different from baseline                                 General Comments: pt reports he has a terrible back, with prior imaging suggesting multiple bulging discs. PT attempts to reassure pt that readings of imaging from this admission do not show any acute abnormalities, however pt perseverates on having multiple bulging discs, unable to be reassured.        General Comments General comments (skin integrity, edema, etc.): VSS on RA    Exercises     Assessment/Plan    PT Assessment Patient needs continued PT services  PT Problem List Decreased strength;Decreased activity tolerance;Decreased balance;Decreased mobility;Decreased cognition;Decreased safety awareness;Decreased knowledge of use of DME;Impaired sensation;Pain       PT Treatment Interventions DME instruction;Gait training;Stair training;Functional mobility training;Therapeutic activities;Therapeutic exercise;Balance training;Neuromuscular re-education;Patient/family education    PT Goals (Current goals can be found in the Care Plan section)  Acute Rehab PT Goals Patient Stated Goal: to reduce pain, improve L side strength PT Goal Formulation: With patient/family Time For Goal Achievement: 06/15/23 Potential to Achieve Goals: Fair    Frequency Min 1X/week     Co-evaluation PT/OT/SLP Co-Evaluation/Treatment: Yes Reason for Co-Treatment: Complexity of the patient's impairments (multi-system involvement);Necessary to address cognition/behavior during functional  activity;For patient/therapist safety;To address functional/ADL transfers PT goals addressed during session: Mobility/safety with mobility;Balance;Strengthening/ROM         AM-PAC PT "6 Clicks" Mobility  Outcome Measure Help needed turning from your back to your side while in a flat bed without using  bedrails?: A Lot Help needed moving from lying on your back to sitting on the side of a flat bed without using bedrails?: A Lot Help needed moving to and from a bed to a chair (including a wheelchair)?: A Lot Help needed standing up from a chair using your arms (e.g., wheelchair or bedside chair)?: A Lot Help needed to walk in hospital room?: A Lot Help needed climbing 3-5 steps with a railing? : Total 6 Click Score: 11    End of Session Equipment Utilized During Treatment: Gait belt Activity Tolerance: Patient limited by pain Patient left: in bed;with call bell/phone within reach;with bed alarm set Nurse Communication: Mobility status PT Visit Diagnosis: Other abnormalities of gait and mobility (R26.89);Muscle weakness (generalized) (M62.81);Other symptoms and signs involving the nervous system (R29.898);Pain Pain - Right/Left: Left Pain - part of body: Leg    Time: 1610-9604 PT Time Calculation (min) (ACUTE ONLY): 22 min   Charges:   PT Evaluation $PT Eval Moderate Complexity: 1 Mod   PT General Charges $$ ACUTE PT VISIT: 1 Visit       Arlyss Gandy, PT, DPT Acute Rehabilitation Office [phone]   Arlyss Gandy 06/01/2023, 2:51 PM

## 2023-06-01 NOTE — Plan of Care (Signed)
Problem: Education: Goal: Knowledge of General Education information will improve Description: Including pain rating scale, medication(s)/side effects and non-pharmacologic comfort measures Outcome: Progressing Pt admitted from the ED for for for s/p fall down some stairs while at work.   Problem: Clinical Measurements: Goal: Ability to maintain clinical measurements within normal limits will improve Outcome: Progressing Pt's VS WNL thus far.        Problem: Clinical Measurements: Goal: Will remain free from infection Outcome: Progressing S/Sx of infection monitored and assessed q-shift.  Pt has remained afebrile thus far.     Problem: Clinical Measurements: Goal: Respiratory complications will improve Outcome: Progressing Respiratory status monitored and assessed q-shift.  Pt is on room air with O2 at 100% and respiration rate of 18 breaths per minute.  He has not endorsed c/o SOB or DOE.     Problem: Activity: Goal: Risk for activity intolerance will decrease Outcome: Progressing Pt is dependent of all his ADLs d/t his fall r/t his hospitalization.  He endorses c/o numbness/ tingling down his left side and throbbing, tingling, sharp, stabbing, penetrating ache throughout his back.     Problem: Nutrition: Goal: Adequate nutrition will be maintained Outcome: Progressing Pt is on a regular diet.  He has been able to tolerate her diet thus far w/o s/sx of abdominal pain/ distention or n/v.     Problem: Elimination: Goal: Will not experience complications related to bowel motility Outcome: Progressing Pt stated his LBM was on 05/31/2023.  He does not endorse c/o constipation or abdominal pain/ distention.  Per MD's order he is on a bowel regiment.    Problem: Elimination: Goal: Will not experience complications related to urinary retention Outcome: Progressing Pt is continent of his bladder.  He has endorsed c/o dysuria.  Per bladder scan he does have urinary retention that  revealed he had of urine in his bladder.  Paged On Called Trauma PA and she ordered for pt to be I/O cath x1.  Went to I/O cath pt and found he had voided of urine into the urinal.     Problem: Safety: Goal: Ability to remain free from injury will improve Outcome: Not Progressing Pt has remained free from falls thus far.  Instructed pt to utilize RN call light for assistance.  Hourly rounds performed.  Bed in lowest position, locked with two upper side rails engaged.  Belongings and call light within reach.              Problem: Skin Integrity: Goal: Risk for impaired skin integrity will decrease Outcome: Progressing Skin integrity monitored and assessed q-shift.  Instructed pt to turn q2 hours to prevent further skin impairment.  Tubes and drains assessed for device related pressure sores.  Pt is continent of both his bowel and bladder.

## 2023-06-01 NOTE — ED Provider Notes (Signed)
[facility] AT [facility] Provider Note   CSN: [id number] Arrival date & time: 06/01/23  1011  An emergency department physician performed an initial assessment on this suspected stroke patient at 68.  History  Chief Complaint  Patient presents with   Trauma    Carlos Cruz is a 27 y.o. male.  HPI      27 year old male with no significant medical history presents as a level 2 trauma initially with concern for fall down 10 steps while at work, with neck pain, back pain, headache, left-sided weakness.  Reports he was in a normal state of health, when he slipped on wet stairs and fell.  Since then he has had severe pain in his neck and his back, and numbness tingling and weakness to his left side.  Reports he has a headache.  Has some discomfort on the bilateral sides of his chest.  Reports his back hurts all over and his neck hurts.  He has double vision, dizziness.  Home Medications Prior to Admission medications   Medication Sig Start Date End Date Taking? Authorizing Provider  acetaminophen (TYLENOL) 500 MG tablet Take 500 mg by mouth every 6 (six) hours as needed for mild pain.   Yes [provider]  ibuprofen (ADVIL) 200 MG tablet Take 200 mg by mouth every 6 (six) hours as needed for mild pain.   Yes [provider]  Multiple Vitamin (MULTI-DAY PO) Take 3 tablets by mouth daily. gummy Patient not taking: Reported on 06/01/2023    [provider]      Allergies    Ciprofloxacin    Review of Systems   Review of Systems  Physical Exam Updated Vital Signs BP 116/66 (BP Location: Right Arm)   Pulse 83   Temp 98.6 F (37 C) (Oral)   Resp 18   Ht 5\' 4"  (1.626 m)   Wt 77.9 kg   SpO2 100%   BMI 29.48 kg/m  Physical Exam Vitals and nursing note reviewed.  Constitutional:      General: He is not in acute distress.    Appearance: Normal appearance. He is well-developed. He is not ill-appearing or  diaphoretic.  HENT:     Head: Normocephalic and atraumatic.  Eyes:     General: No visual field deficit.    Extraocular Movements: Extraocular movements intact.     Conjunctiva/sclera: Conjunctivae normal.     Pupils: Pupils are equal, round, and reactive to light.  Cardiovascular:     Rate and Rhythm: Normal rate and regular rhythm.     Pulses: Normal pulses.     Heart sounds: Normal heart sounds. No murmur heard.    No friction rub. No gallop.  Pulmonary:     Effort: Pulmonary effort is normal. No respiratory distress.     Breath sounds: Normal breath sounds. No wheezing or rales.  Chest:     Chest wall: Tenderness present.  Abdominal:     General: There is no distension.     Palpations: Abdomen is soft.     Tenderness: There is no abdominal tenderness. There is no guarding.  Musculoskeletal:        General: No swelling or tenderness.     Cervical back: Normal range of motion.  Skin:    General: Skin is warm and dry.     Findings: No erythema or rash.  Neurological:     General: No focal deficit present.     Mental Status: He is alert and  oriented to person, place, and time.     GCS: GCS eye subscore is 4. GCS verbal subscore is 5. GCS motor subscore is 6.     Cranial Nerves: No cranial nerve deficit, dysarthria or facial asymmetry.     Sensory: Sensory deficit (left sided) present.     Motor: Weakness (left sided arm and leg) present. No tremor.     Coordination: Coordination normal. Finger-Nose-Finger Test normal.     Gait: Gait normal.     Comments: Nystagmus      ED Results / Procedures / Treatments   Labs (all labs ordered are listed, but only abnormal results are displayed) Labs Reviewed  COMPREHENSIVE METABOLIC PANEL - Abnormal; Notable for the following components:      Result Value   Sodium 133 (*)    CO2 19 (*)    Calcium 8.8 (*)    Total Protein 6.1 (*)    AST 50 (*)    ALT 110 (*)    All other components within normal limits  I-STAT CHEM 8, ED -  Abnormal; Notable for the following components:   Potassium 3.4 (*)    Creatinine, Ser 0.60 (*)    Calcium, Ion 1.05 (*)    TCO2 19 (*)    All other components within normal limits  CBC  ETHANOL  PROTIME-INR  HIV ANTIBODY (ROUTINE TESTING W REFLEX)  URINALYSIS, ROUTINE W REFLEX MICROSCOPIC  CBC  BASIC METABOLIC PANEL  I-STAT CG4 LACTIC ACID, ED  CBG MONITORING, ED  SAMPLE TO BLOOD BANK    EKG EKG Interpretation Date/Time:  Wednesday June 01 2023 10:52:14 EDT Ventricular Rate:  64 PR Interval:  142 QRS Duration:  104 QT Interval:  436 QTC Calculation: 450 R Axis:   20  Text Interpretation: Sinus rhythm No significant change since last tracing Confirmed by Alvira Monday ([id number]) on 06/02/2023 12:17:49 AM  Radiology MR BRAIN WO CONTRAST  Result Date: 06/01/2023 CLINICAL DATA:  27 year old male trauma patient with fall down stairs. Unilateral weakness and neurologic deficits. EXAM: MRI HEAD WITHOUT CONTRAST TECHNIQUE: Multiplanar, multiecho pulse sequences of the brain and surrounding structures were obtained without intravenous contrast. COMPARISON:  CT head, CTA head and neck FINDINGS: Brain: Normal cerebral volume. No restricted diffusion to suggest acute infarction. No midline shift, mass effect, evidence of mass lesion, ventriculomegaly, extra-axial collection or acute intracranial hemorrhage. Cervicomedullary junction and pituitary are within normal limits. Wallace Cullens and white matter signal is within normal limits throughout the brain. No cerebral edema identified. No encephalomalacia identified. No chronic cerebral blood products on T2*. Vascular: Major intracranial vascular flow voids are preserved. Skull and upper cervical spine: Negative visible cervical spine, bone marrow signal. Sinuses/Orbits: Negative; trace paranasal sinus mucosal thickening. Other: Mastoids are clear. Grossly normal visible internal auditory structures. Visible scalp and face appear negative. IMPRESSION:  Normal noncontrast MRI appearance of the Brain. Electronically Signed   By: Odessa Fleming M.D.   On: 06/01/2023 12:29   CT C-SPINE NO CHARGE  Result Date: 06/01/2023 CLINICAL DATA:  27 year old male status post level 1 trauma. Fell down stairs, subsequent unilateral weakness and neurologic deficits. EXAM: CT CERVICAL SPINE WITH CONTRAST TECHNIQUE: Multiplanar CT images of the cervical spine were reconstructed from contemporary CTA of the Neck. RADIATION DOSE REDUCTION: This exam was performed according to the departmental dose-optimization program which includes automated exposure control, adjustment of the mA and/or kV according to patient size and/or use of iterative reconstruction technique. CONTRAST:  None or No additional COMPARISON:  CT head,  CTA head and neck reported separately. Previous cervical spine CT 12/16/2020. FINDINGS: Alignment: Preserved cervical lordosis. Cervicothoracic junction alignment is within normal limits. Bilateral posterior element alignment is within normal limits. Skull base and vertebrae: Bone mineralization is within normal limits. Visualized skull base is intact. No atlanto-occipital dissociation. C1 and C2 appear intact and aligned. Mild motion artifact. No acute osseous abnormality identified. Soft tissues and spinal canal: No prevertebral fluid or swelling. No visible canal hematoma. Neck CTA today is reported separately. Disc levels:  Negative. Upper chest: Chest CT is reported separately. Other: Carious left maxillary anterior molar incidentally noted. Preliminary results of the above discussed with Dr. Leonard Schwartz. Thompson on 06/01/2023 at 10:58 . IMPRESSION: 1. Mild motion artifact. No acute traumatic injury identified in the cervical spine. 2. CTA Head And Neck reported separately. 3. Carious left maxillary anterior molar. Electronically Signed   By: Odessa Fleming M.D.   On: 06/01/2023 11:17   CT ANGIO HEAD NECK W WO CM  Result Date: 06/01/2023 CLINICAL DATA:  27 year old male status post  level 1 trauma. Fell down stairs, subsequent unilateral weakness and neurologic deficits. EXAM: CT ANGIOGRAPHY HEAD AND NECK WITH AND WITHOUT CONTRAST TECHNIQUE: Multidetector CT imaging of the head and neck was performed using the standard protocol during bolus administration of intravenous contrast. Multiplanar CT image reconstructions and MIPs were obtained to evaluate the vascular anatomy. Carotid stenosis measurements (when applicable) are obtained utilizing NASCET criteria, using the distal internal carotid diameter as the denominator. RADIATION DOSE REDUCTION: This exam was performed according to the departmental dose-optimization program which includes automated exposure control, adjustment of the mA and/or kV according to patient size and/or use of iterative reconstruction technique. CONTRAST:  75mL OMNIPAQUE IOHEXOL 350 MG/ML SOLN COMPARISON:  Cervical spine and chest CT today reported separately. Prior head CT 12/16/2020. FINDINGS: CT HEAD Brain: Normal cerebral volume. No midline shift, ventriculomegaly, mass effect, evidence of mass lesion, intracranial hemorrhage or evidence of cortically based acute infarction. Gray-white matter differentiation is within normal limits throughout the brain. Calvarium and skull base: No fracture identified. Paranasal sinuses: Visualized paranasal sinuses and mastoids are stable and well aerated. Orbits: Visualized orbits and scalp soft tissues are within normal limits. CTA NECK Skeleton: Cervical spine is detailed separately. Upper chest: Negative aside from mild motion artifact, Chest CT today is reported separately. Other neck: Mild motion artifact. Thyroid, larynx, pharynx, parapharyngeal spaces, retropharyngeal space, sublingual space, submandibular spaces, masticator and parotid spaces are within normal limits. No posttraumatic soft tissue gas identified. Aortic arch: Mild cardiac pulsation.  Normal 3 vessel arch. Right carotid system: Normal allowing for mild  motion artifact. Left carotid system: Normal. Vertebral arteries: Proximal right subclavian artery and right vertebral artery origin are normal. Right vertebral is patent and within normal limits to the skull base. Proximal left subclavian artery and left vertebral artery origin are normal. Left vertebral artery is slightly dominant, patent and within normal limits to the skull base. CTA HEAD Posterior circulation: Patent distal vertebral arteries and vertebrobasilar junction, left V4 segment is dominant. Both PICA origins are patent, left PICA origin may be fenestrated (normal variant). Patent basilar artery without stenosis. Patent SCA and PCA origins. Posterior communicating arteries are diminutive or absent. Left PCA bifurcates early. Bilateral PCA branches are within normal limits. Anterior circulation: Both ICA siphons appear patent and normal. Normal left posterior communicating artery origin. Patent carotid termini. Normal MCA and ACA origins. Diminutive or absent anterior communicating artery. Bilateral ACA branches are within normal limits. Left MCA M1  segment and bifurcation are patent without stenosis. Right MCA M1 segment and bifurcation are patent without stenosis. Bilateral MCA branches are within normal limits. Venous sinuses: Patent. Anatomic variants: Mildly dominant left vertebral artery. Early branching of the left PCA. Possible fenestration of the left PICA origin. Review of the MIP images confirms the above findings And preliminary results of the above discussed with Dr. Leonard Schwartz. Thompson on 06/01/2023 at 10:58 . IMPRESSION: 1. Normal CTA Head and Neck.  No acute traumatic injury identified. 2. Normal CT appearance of the brain. 3. Cervical spine and Chest CT today are reported separately. Electronically Signed   By: Odessa Fleming M.D.   On: 06/01/2023 11:12   CT CHEST ABDOMEN PELVIS W CONTRAST  Addendum Date: 06/01/2023   ADDENDUM REPORT: 06/01/2023 11:10 ADDENDUM: Contrast volume missing from original  report: 75 cc Omnipaque 350 intravenously. Electronically Signed   By: Carey Bullocks M.D.   On: 06/01/2023 11:10   Result Date: 06/01/2023 CLINICAL DATA:  Status post fall.  Poly trauma.  Trauma 1 patient. EXAM: CT CHEST, ABDOMEN, AND PELVIS WITH CONTRAST TECHNIQUE: Multidetector CT imaging of the chest, abdomen and pelvis was performed following the standard protocol during bolus administration of intravenous contrast. RADIATION DOSE REDUCTION: This exam was performed according to the departmental dose-optimization program which includes automated exposure control, adjustment of the mA and/or kV according to patient size and/or use of iterative reconstruction technique. COMPARISON:  Chest radiograph same date. Abdominopelvic CT 07/11/2016. FINDINGS: CT CHEST FINDINGS Cardiovascular: No evidence of acute vascular injury or mediastinal hematoma. The thoracic aorta and great vessels appear normal. The heart size is normal. There is no pericardial effusion. Mediastinum/Nodes: There are no enlarged mediastinal, hilar or axillary lymph nodes. Small amount of residual thymic tissue in the anterior mediastinum. The thyroid gland, trachea and esophagus demonstrate no significant findings. Lungs/Pleura: No pleural effusion or pneumothorax. The lungs are clear. Musculoskeletal/Chest wall: No evidence of acute fracture or chest wall hematoma. Spine findings are dictated separately. CT ABDOMEN AND PELVIS FINDINGS Hepatobiliary: No evidence of acute hepatic injury. There is focal fat adjacent to the falciform ligament. No evidence of gallstones, gallbladder wall thickening or biliary dilatation. Pancreas: Unremarkable. No pancreatic ductal dilatation or surrounding inflammatory changes. Spleen: No evidence of acute splenic injury. The spleen appears normal. Adrenals/Urinary Tract: Both adrenal glands appear normal. No evidence of urinary tract calculus, suspicious renal lesion or hydronephrosis. The bladder appears normal  for its degree of distention. Stomach/Bowel: No enteric contrast administered. The stomach appears unremarkable for its degree of distension. No evidence of bowel wall thickening, distention or surrounding inflammatory change. No evidence of bowel or mesenteric injury. The appendix appears normal. Vascular/Lymphatic: There are no enlarged abdominal or pelvic lymph nodes. No evidence of acute vascular injury or retroperitoneal hematoma. Reproductive: The prostate gland and seminal vesicles appear normal. Other: Intact abdominal wall. No evidence of hemoperitoneum or pneumoperitoneum. Musculoskeletal: No acute or significant osseous findings. Spine details are dictated separately. IMPRESSION: 1. No evidence of acute injury within the chest, abdomen or pelvis. 2. Spine details dictated separately. 3. These results were reviewed in person shortly after its performance on 06/01/2023 at 10:50 am with Dr. Violeta Gelinas and the trauma team. Electronically Signed: By: Carey Bullocks M.D. On: 06/01/2023 11:02   CT L-SPINE NO CHARGE  Result Date: 06/01/2023 CLINICAL DATA:  Trauma 1 patient.  Fall.  Back pain. EXAM: CT Thoracic and Lumbar spine without contrast TECHNIQUE: Multiplanar CT images of the thoracic and lumbar spine were reconstructed  from contemporary CT of the Chest, Abdomen, and Pelvis. RADIATION DOSE REDUCTION: This exam was performed according to the departmental dose-optimization program which includes automated exposure control, adjustment of the mA and/or kV according to patient size and/or use of iterative reconstruction technique. CONTRAST:  No additional COMPARISON:  Abdominopelvic CT 07/11/2016. FINDINGS: CT THORACIC SPINE FINDINGS Alignment: Normal. Vertebrae: No evidence of acute fracture or traumatic subluxation. Scattered endplate irregularity in the lower thoracic spine, likely developmental. Paraspinal and other soft tissues: No acute paraspinal findings. Disc levels: No evidence of large disc  herniation, spinal stenosis or nerve root encroachment. CT LUMBAR SPINE FINDINGS Segmentation: There are 5 lumbar type vertebral bodies. Alignment: Normal. Vertebrae: No evidence of acute fracture or traumatic subluxation. No evidence of pars defect. Paraspinal and other soft tissues: Unremarkable. Disc levels: No significant spondylosis. No disc herniation, spinal stenosis or nerve root encroachment. IMPRESSION: 1. No evidence of acute fracture or traumatic subluxation of the thoracolumbar spine. 2. No significant spondylosis. 3. No evidence of large disc herniation, spinal stenosis or nerve root encroachment. Electronically Signed   By: Carey Bullocks M.D.   On: 06/01/2023 11:09   CT T-SPINE NO CHARGE  Result Date: 06/01/2023 CLINICAL DATA:  Trauma 1 patient.  Fall.  Back pain. EXAM: CT Thoracic and Lumbar spine without contrast TECHNIQUE: Multiplanar CT images of the thoracic and lumbar spine were reconstructed from contemporary CT of the Chest, Abdomen, and Pelvis. RADIATION DOSE REDUCTION: This exam was performed according to the departmental dose-optimization program which includes automated exposure control, adjustment of the mA and/or kV according to patient size and/or use of iterative reconstruction technique. CONTRAST:  No additional COMPARISON:  Abdominopelvic CT 07/11/2016. FINDINGS: CT THORACIC SPINE FINDINGS Alignment: Normal. Vertebrae: No evidence of acute fracture or traumatic subluxation. Scattered endplate irregularity in the lower thoracic spine, likely developmental. Paraspinal and other soft tissues: No acute paraspinal findings. Disc levels: No evidence of large disc herniation, spinal stenosis or nerve root encroachment. CT LUMBAR SPINE FINDINGS Segmentation: There are 5 lumbar type vertebral bodies. Alignment: Normal. Vertebrae: No evidence of acute fracture or traumatic subluxation. No evidence of pars defect. Paraspinal and other soft tissues: Unremarkable. Disc levels: No  significant spondylosis. No disc herniation, spinal stenosis or nerve root encroachment. IMPRESSION: 1. No evidence of acute fracture or traumatic subluxation of the thoracolumbar spine. 2. No significant spondylosis. 3. No evidence of large disc herniation, spinal stenosis or nerve root encroachment. Electronically Signed   By: Carey Bullocks M.D.   On: 06/01/2023 11:09   DG Chest Port 1 View  Result Date: 06/01/2023 CLINICAL DATA:  Level 1 trauma patient. EXAM: PORTABLE CHEST 1 VIEW COMPARISON:  Prior radiographs 12/16/2020. FINDINGS: Portable chest: 1023 hours. The heart size and mediastinal contours are stable without evidence of mediastinal hematoma. The lungs are clear. There is no pleural effusion or pneumothorax. No acute fractures are identified. Telemetry leads overlie the chest. AP pelvis: The mineralization and alignment are normal. There is no evidence of acute fracture or dislocation. The hip and sacroiliac joint spaces are preserved. The soft tissues appear unremarkable. IMPRESSION: 1. No evidence of acute chest injury or active cardiopulmonary process. 2. No evidence of acute pelvic injury. Electronically Signed   By: Carey Bullocks M.D.   On: 06/01/2023 10:44   DG Pelvis Portable  Result Date: 06/01/2023 CLINICAL DATA:  Level 1 trauma patient. EXAM: PORTABLE CHEST 1 VIEW COMPARISON:  Prior radiographs 12/16/2020. FINDINGS: Portable chest: 1023 hours. The heart size and mediastinal contours are stable  without evidence of mediastinal hematoma. The lungs are clear. There is no pleural effusion or pneumothorax. No acute fractures are identified. Telemetry leads overlie the chest. AP pelvis: The mineralization and alignment are normal. There is no evidence of acute fracture or dislocation. The hip and sacroiliac joint spaces are preserved. The soft tissues appear unremarkable. IMPRESSION: 1. No evidence of acute chest injury or active cardiopulmonary process. 2. No evidence of acute pelvic  injury. Electronically Signed   By: Carey Bullocks M.D.   On: 06/01/2023 10:44    Procedures Procedures    Medications Ordered in ED Medications  acetaminophen (TYLENOL) tablet 1,000 mg (1,000 mg Oral Given 06/01/23 2040)  methocarbamol (ROBAXIN) tablet 500 mg (500 mg Oral Given 06/01/23 1842)    Or  methocarbamol (ROBAXIN) 500 mg in dextrose 5 % 50 mL IVPB ( Intravenous See Alternative 06/01/23 1842)  docusate sodium (COLACE) capsule 100 mg (100 mg Oral Given 06/01/23 2041)  polyethylene glycol (MIRALAX / GLYCOLAX) packet 17 g (has no administration in time range)  ondansetron (ZOFRAN-ODT) disintegrating tablet 4 mg ( Oral See Alternative 06/01/23 2316)    Or  ondansetron (ZOFRAN) injection 4 mg (4 mg Intravenous Given 06/01/23 2316)  metoprolol tartrate (LOPRESSOR) injection 5 mg (has no administration in time range)  hydrALAZINE (APRESOLINE) injection 10 mg (has no administration in time range)  sodium chloride flush (NS) 0.9 % injection 3 mL (3 mLs Intravenous Given 06/01/23 2040)  sodium chloride flush (NS) 0.9 % injection 3 mL (has no administration in time range)  0.9 %  sodium chloride infusion (has no administration in time range)  ibuprofen (ADVIL) tablet 400-800 mg (800 mg Oral Given 06/01/23 2303)  enoxaparin (LOVENOX) injection 30 mg (has no administration in time range)  gabapentin (NEURONTIN) capsule 300 mg (300 mg Oral Given 06/01/23 2040)  LORazepam (ATIVAN) injection 0.5 mg (0.5 mg Intravenous Given 06/01/23 1319)  fentaNYL (SUBLIMAZE) injection 25 mcg (25 mcg Intravenous Given 06/01/23 1319)  iohexol (OMNIPAQUE) 350 MG/ML injection 75 mL (75 mLs Intravenous Contrast Given 06/01/23 1105)    ED Course/ Medical Decision Making/ A&P                               27 year old male with no significant medical history presents as a level 2 trauma initially with concern for fall down 10 steps while at work, with neck pain, back pain, headache, left-sided weakness.  Initially  arrived as a level 2 trauma from urgent care.  Did decide to upgrade when he is found to have significant left-sided weakness on exam, with concern for possible intracranial or acute spinal pathology.  In addition to discussion of possible traumatic etiology findings, discussed with Dr. Jerrell Belfast of neurology, and did call a code stroke when no traumatic intracranial bleed or fracture was seen to evaluate for other emergent etiologies of left sided numbness, weakness and double vision.   Dr. Janee Morn of surgery, and Dr. Wilford Corner at bedside.  CT head, cervical spine, chest abdomen pelvis and CTA head and neck were obtained given fall down 10 stairs, neurologic symptoms, unclear if reliable history.  CT were completed showing no evidence of intracranial, spinal, or injuries to the chest abdomen or pelvis.  CTA without large vessel dissection or occlusion.  EKG was completed and personally read interpreted by me with normal sinus rhythm  Labs completed and evaluated by me show no clinically significant electrolyte abnormalities, transaminitis, or leukocytosis.  MRI ordered  of the brain given symptoms shows no evidence of CVA.   Suspect symptoms related to concussion. Admitted for further observation to trauma.        Final Clinical Impression(s) / ED Diagnoses Final diagnoses:  Fall down stairs, initial encounter  Concussion without loss of consciousness, initial encounter  Left-sided weakness  Double vision    Rx / DC Orders ED Discharge Orders     None         Alvira Monday, MD 06/02/23 0021

## 2023-06-02 LAB — BASIC METABOLIC PANEL
Anion gap: 10 (ref 5–15)
BUN: 17 mg/dL (ref 6–20)
CO2: 21 mmol/L — ABNORMAL LOW (ref 22–32)
Calcium: 8.2 mg/dL — ABNORMAL LOW (ref 8.9–10.3)
Chloride: 104 mmol/L (ref 98–111)
Creatinine, Ser: 0.84 mg/dL (ref 0.61–1.24)
GFR, Estimated: >60 mL/min (ref >60)
Glucose, Bld: 120 mg/dL — ABNORMAL HIGH (ref 70–99)
Potassium: 3.7 mmol/L (ref 3.5–5.1)
Sodium: 135 mmol/L (ref 135–145)

## 2023-06-02 LAB — CBC
HCT: 41.2 % (ref 39.0–52.0)
Hemoglobin: 13.9 g/dL (ref 13.0–17.0)
MCH: 29.6 pg (ref 26.0–34.0)
MCHC: 33.7 g/dL (ref 30.0–36.0)
Platelets: 255 10*3/uL (ref 150–400)
RBC: 4.7 MIL/uL (ref 4.22–5.81)
RDW: 12.2 % (ref 11.5–15.5)
WBC: 6.3 10*3/uL (ref 4.0–10.5)
nRBC: 0 % (ref 0.0–0.2)

## 2023-06-02 NOTE — Progress Notes (Signed)
Called back as the patient had more questions.  All questions he asked were answered and discussed.  He stated he had more questions and I offered for him to ask and answer them; however, he decided that he no longer wanted to ask any further questions.  I again offered to answer any questions he had and he refused to ask.  He stated that he was fine.  Discussed with RN at nurse's station.  Letha Cape 10:48 AM 06/02/2023

## 2023-06-02 NOTE — Progress Notes (Signed)
Upon assessment pt complained of throbbing and shooting pain in neck, left shoulder, arm, and leg. Numbness and tingling in left arm and left leg. Pain score still 8-9 despite pain meds. Pt cannot lift left leg off of bed and left arm drops significantly when asked to extend arms out. Dr. Freida Busman notified per above. Asked Dr. If a different pain med could be ordered due to pt only having advil and tylenol and pain score still being 8-9 despite taking meds.Dr. ordered gabapentin, gabapentin and  tylenol given. Pain assessed later, pain score 9, gave advil. Pain later assessed, pt observed sleeping. Pt mentioned him having a bulging disk that was supposed to be operated on but operation was never done. Dr. was asked about possibly ordering a MRI of spine just to rule out any abnormalities and because of worsening of symptoms. Dr. Algis Downs "He had the weakness at admission and has already been evaluated by neurology. ." I later reached out to neurologist Dr. Milon Dikes per above also asking for MRI spine. Dr. advised CT head/neck, abdomen, spine and MRI head has been done and no need for any other workups.Relayed information per above to pt and dayshift nurse.

## 2023-06-02 NOTE — Discharge Instructions (Signed)
May resume normal activities as able

## 2023-06-02 NOTE — Discharge Summary (Signed)
    Patient ID: Carlos Cruz [id number] [birth year] 27 y.o.  Admit date: 06/01/2023 Discharge date: 06/02/2023  Admitting Diagnosis: Fall down steps ?concussion   Discharge Diagnosis Patient Active Problem List   Diagnosis Date Noted   Fall down stairs 06/01/2023  No injuries identified  Consultants Dr. Wilford Corner, neurology  Reason for Admission: This is a 27 yo male who is unable to provide a consistent history. It sounds like he fell down some steps. He initially says he can't remember what happened, but later stated he slipped down the steps. He went to an urgent care in [facility] and complained of weakness in his lower extremities. He was then sent via EMS to [facility] ED. Here he complains of a bad HA, diplopia, initially left-sided weakness, then numbness in his face, severe back pain, IVs feel like they are tickling him, etc. His vitals have been stable. He underwent a trauma work up. This was all negative. He is currently undergoing an MRI of his head/neck.   Procedures none  Hospital Course:  The patient was admitted for observation.  His full trauma and neurologic work up were negative.  His neurologic exam was inconsistent with any injury pattern.  On HD 1 he had no pain, was moving all extremities, eating well, voiding, and had no neuro deficits.  He was eager and asking to go home.  He was felt medically stable for this.  Physical Exam: Gen: NAD, laying in bed talking on his phone HEENT: PERRL Heart: regular Lungs: respiratory effort unlabored Abd: ND Ext: MAE.  Using his hands to talk and type on his phone.  Neuro: completely intact.  Has 5/5 strength in his BUE and BLEs in all joints.  He can lift his legs off the bed to command.  He is using his fingers for fine motor to use his cell phone.   Psych: A&Ox3  Allergies as of 06/02/2023       Reactions   Ciprofloxacin Hives        Medication List     TAKE these medications    acetaminophen 500 MG  tablet Commonly known as: TYLENOL Take 500 mg by mouth every 6 (six) hours as needed for mild pain.   ibuprofen 200 MG tablet Commonly known as: ADVIL Take 200 mg by mouth every 6 (six) hours as needed for mild pain.   MULTI-DAY PO Take 3 tablets by mouth daily. gummy          Follow-up Information     Primary care provider Follow up.   Why: As needed                Signed: Barnetta Chapel, [facility] 06/02/2023, 9:12 AM Please see Amion for pager number during day hours 7:00am-4:30pm, 7-11:30am on Weekends

## 2023-06-02 NOTE — Plan of Care (Signed)
Patient alert/oriented X4. Patient compliant with medication administration and ambulated independently to restroom without having any feelings of dizziness/weakness. Patient AVS discharge explained in detail to patient and patient belongings packed up at bedside. PIV removed prior to discharge. VSS, no complaints at this time.   Problem: Education: Goal: Knowledge of General Education information will improve Description: Including pain rating scale, medication(s)/side effects and non-pharmacologic comfort measures Outcome: Adequate for Discharge   Problem: Health Behavior/Discharge Planning: Goal: Ability to manage health-related needs will improve Outcome: Adequate for Discharge   Problem: Clinical Measurements: Goal: Ability to maintain clinical measurements within normal limits will improve Outcome: Adequate for Discharge   Problem: Clinical Measurements: Goal: Will remain free from infection Outcome: Adequate for Discharge   Problem: Clinical Measurements: Goal: Diagnostic test results will improve Outcome: Adequate for Discharge   Problem: Clinical Measurements: Goal: Respiratory complications will improve Outcome: Adequate for Discharge   Problem: Clinical Measurements: Goal: Cardiovascular complication will be avoided Outcome: Adequate for Discharge   Problem: Activity: Goal: Risk for activity intolerance will decrease Outcome: Adequate for Discharge   Problem: Nutrition: Goal: Adequate nutrition will be maintained Outcome: Adequate for Discharge   Problem: Coping: Goal: Level of anxiety will decrease Outcome: Adequate for Discharge   Problem: Elimination: Goal: Will not experience complications related to bowel motility Outcome: Adequate for Discharge   Problem: Elimination: Goal: Will not experience complications related to urinary retention Outcome: Adequate for Discharge   Problem: Pain Managment: Goal: General experience of comfort will  improve Outcome: Adequate for Discharge   Problem: Safety: Goal: Ability to remain free from injury will improve Outcome: Adequate for Discharge   Problem: Skin Integrity: Goal: Risk for impaired skin integrity will decrease Outcome: Adequate for Discharge

## 2023-06-07 ENCOUNTER — Ambulatory Visit (INDEPENDENT_AMBULATORY_CARE_PROVIDER_SITE_OTHER): Payer: Self-pay | Admitting: Nurse Practitioner

## 2023-06-07 ENCOUNTER — Encounter: Payer: Self-pay | Admitting: Nurse Practitioner

## 2023-06-07 VITALS — BP 112/68 | HR 95 | Temp 97.1°F | Wt 170.4 lb

## 2023-06-07 DIAGNOSIS — Z09 Encounter for follow-up examination after completed treatment for conditions other than malignant neoplasm: Secondary | ICD-10-CM | POA: Insufficient documentation

## 2023-06-07 DIAGNOSIS — U071 COVID-19: Secondary | ICD-10-CM

## 2023-06-07 DIAGNOSIS — M7918 Myalgia, other site: Secondary | ICD-10-CM | POA: Insufficient documentation

## 2023-06-07 DIAGNOSIS — W108XXD Fall (on) (from) other stairs and steps, subsequent encounter: Secondary | ICD-10-CM

## 2023-06-07 MED ORDER — BENZONATATE 100 MG PO CAPS: 100.0000 mg | ORAL_CAPSULE | Freq: Three times a day (TID) | ORAL | 0 refills | Status: DC | PRN

## 2023-06-07 MED ORDER — NIRMATRELVIR/RITONAVIR (PAXLOVID)TABLET: 3.0000 | ORAL_TABLET | Freq: Two times a day (BID) | ORAL | 0 refills | Status: AC

## 2023-06-07 MED ORDER — IBUPROFEN 600 MG PO TABS: 600.0000 mg | ORAL_TABLET | Freq: Three times a day (TID) | ORAL | 0 refills | Status: DC | PRN

## 2023-06-07 MED ORDER — CYCLOBENZAPRINE HCL 5 MG PO TABS: 5.0000 mg | ORAL_TABLET | Freq: Three times a day (TID) | ORAL | 0 refills | Status: DC | PRN

## 2023-06-07 NOTE — Progress Notes (Signed)
New Patient Office Visit  Subjective:  Patient ID: Carlos Cruz, male    DOB: August 21, 1996  Age: 27 y.o. MRN: [id number]  CC:  Chief Complaint  Patient presents with   Hospitalization Follow-up   Fall    Neck , back and left leg pain    HPI she will remain on Domnick Itkin is a 27 y.o. male  has a past medical history of Asthma and Attention deficit.  Patient presents for hospital discharge follow-up patient was admitted to the hospital from 06/01/2023 to 06/02/2023 after falling down the stairs at work.  He underwent a trauma workup at the hospital these were all negative.  Today patient complains of generalized aching pain rated 8/10 has pain on his neck, back and left leg.  Stated that he has not taken any medication for his pain.  Patient complains of acute cough for the past 3 days.  He reports chills but no fever, wheezing, shortness of breath.    Past Medical History:  Diagnosis Date   Asthma    Attention deficit     Past Surgical History:  Procedure Laterality Date   CYSTOSCOPY      History reviewed. No pertinent family history.  Social History   Socioeconomic History   Marital status: Married    Spouse name: Not on file   Number of children: 3   Years of education: Not on file   Highest education level: Not on file  Occupational History   Not on file  Tobacco Use   Smoking status: Never   Smokeless tobacco: Never  Vaping Use   Vaping status: Never Used  Substance and Sexual Activity   Alcohol use: Yes    Comment: occasionally   Drug use: No   Sexual activity: Yes    Birth control/protection: None  Other Topics Concern   Not on file  Social History Narrative   Lives with his wife and children    Social Determinants of Health   Financial Resource Strain: Not on file  Food Insecurity: No Food Insecurity (06/01/2023)   Hunger Vital Sign    Worried About Running Out of Food in the Last Year: Never true    Ran Out of Food in the Last Year:  Never true  Transportation Needs: No Transportation Needs (06/01/2023)   PRAPARE - Administrator, Civil Service (Medical): No    Lack of Transportation (Non-Medical): No  Physical Activity: Not on file  Stress: Not on file  Social Connections: Not on file  Intimate Partner Violence: Not At Risk (06/01/2023)   Humiliation, Afraid, Rape, and Kick questionnaire    Fear of Current or Ex-Partner: No    Emotionally Abused: No    Physically Abused: No    Sexually Abused: No    ROS Review of Systems  Constitutional:  Negative for activity change, appetite change, chills, diaphoresis, fatigue, fever and unexpected weight change.  HENT:  Negative for congestion, dental problem, drooling and ear discharge.   Eyes:  Negative for pain, discharge, redness and itching.  Respiratory:  Positive for cough. Negative for apnea, choking, chest tightness, shortness of breath and wheezing.   Cardiovascular: Negative.  Negative for chest pain, palpitations and leg swelling.  Gastrointestinal:  Negative for abdominal distention, abdominal pain, anal bleeding, blood in stool, constipation, diarrhea and vomiting.  Endocrine: Negative for polydipsia, polyphagia and polyuria.  Genitourinary:  Negative for difficulty urinating, flank pain, frequency and genital sores.  Musculoskeletal:  Positive for arthralgias and back  pain. Negative for gait problem and joint swelling.  Skin:  Negative for color change, pallor and rash.  Neurological:  Negative for dizziness, facial asymmetry, light-headedness, numbness and headaches.  Psychiatric/Behavioral:  Negative for agitation, behavioral problems, confusion, hallucinations, self-injury, sleep disturbance and suicidal ideas.     Objective:   Today's Vitals: BP 112/68   Pulse 95   Temp (!) 97.1 F (36.2 C)   Wt 170 lb 6.4 oz (77.3 kg)   SpO2 100%   BMI 29.25 kg/m   Physical Exam Vitals and nursing note reviewed.  Constitutional:      General: He is  not in acute distress.    Appearance: Normal appearance. He is not ill-appearing, toxic-appearing or diaphoretic.  HENT:     Mouth/Throat:     Mouth: Mucous membranes are moist.     Pharynx: Oropharynx is clear. No oropharyngeal exudate or posterior oropharyngeal erythema.  Eyes:     General: No scleral icterus.       Right eye: No discharge.        Left eye: No discharge.     Extraocular Movements: Extraocular movements intact.     Conjunctiva/sclera: Conjunctivae normal.  Cardiovascular:     Rate and Rhythm: Normal rate and regular rhythm.     Pulses: Normal pulses.     Heart sounds: Normal heart sounds. No murmur heard.    No friction rub. No gallop.  Pulmonary:     Effort: Pulmonary effort is normal. No respiratory distress.     Breath sounds: Normal breath sounds. No stridor. No wheezing, rhonchi or rales.  Chest:     Chest wall: No tenderness.  Abdominal:     General: There is no distension.     Palpations: Abdomen is soft.     Tenderness: There is no abdominal tenderness. There is no right CVA tenderness, left CVA tenderness or guarding.  Musculoskeletal:        General: Tenderness present. No swelling, deformity or signs of injury.     Cervical back: Normal range of motion.     Right lower leg: No edema.     Left lower leg: No edema.     Comments: Tenderness on palpation of mid upper back, has some bruises on his left arm.  Patient able to ambulate independently  Skin:    General: Skin is warm and dry.     Capillary Refill: Capillary refill takes less than 2 seconds.     Coloration: Skin is not jaundiced or pale.     Findings: No bruising, erythema or lesion.  Neurological:     Mental Status: He is alert and oriented to person, place, and time.     Motor: No weakness.     Coordination: Coordination normal.     Gait: Gait normal.  Psychiatric:        Mood and Affect: Mood normal.        Behavior: Behavior normal.        Thought Content: Thought content normal.         Judgment: Judgment normal.     Assessment & Plan:   Problem List Items Addressed This Visit       Other   Fall down stairs - Primary    Imaging studies done at the hospital with negative for fractures Ibuprofen and Flexeril ordered today to be taken as needed. Stated that he is not ready to go back to work, work note provided with plans to go back to work next  week Monday      Hospital discharge follow-up    Hospital discharge summary, labs and imaging studies reviewed by me today      Musculoskeletal pain    Due to recent fall  - ibuprofen (ADVIL) 600 MG tablet; Take 1 tablet (600 mg total) by mouth every 8 (eight) hours as needed.  Dispense: 30 tablet; Refill: 0 - cyclobenzaprine (FLEXERIL) 5 MG tablet; Take 1 tablet (5 mg total) by mouth 3 (three) times daily as needed for muscle spasms.  Dispense: 30 tablet; Refill: 0 Toradol 30 mg IM injection given in the office today.  Patient told to alternate ibuprofen with Tylenol 650 mg every 6 hours as needed.  Side effects of Flexeril including drowsiness discussed        Relevant Medications   ibuprofen (ADVIL) 600 MG tablet   cyclobenzaprine (FLEXERIL) 5 MG tablet   COVID-19 virus infection     - nirmatrelvir/ritonavir (PAXLOVID) 20 x 150 MG & 10 x 100MG  TABS; Take 3 tablets by mouth 2 (two) times daily for 5 days. (Take nirmatrelvir 150 mg two tablets twice daily for 5 days and ritonavir 100 mg one tablet twice daily for 5 days) Patient GFR is 60  Dispense: 30 tablet; Refill: 0 - benzonatate (TESSALON) 100 MG capsule; Take 1 capsule (100 mg total) by mouth 3 (three) times daily as needed for cough.  Dispense: 20 capsule; Refill: 0  Patient encouraged to remain in isolation for 5 days Importance of proper hand hygiene discussed      Relevant Medications   nirmatrelvir/ritonavir (PAXLOVID) 20 x 150 MG & 10 x 100MG  TABS   benzonatate (TESSALON) 100 MG capsule    Outpatient Encounter Medications as of 06/07/2023   Medication Sig   benzonatate (TESSALON) 100 MG capsule Take 1 capsule (100 mg total) by mouth 3 (three) times daily as needed for cough.   cyclobenzaprine (FLEXERIL) 5 MG tablet Take 1 tablet (5 mg total) by mouth 3 (three) times daily as needed for muscle spasms.   gabapentin (NEURONTIN) 300 MG capsule Take 300 mg by mouth 3 (three) times daily.   ibuprofen (ADVIL) 600 MG tablet Take 1 tablet (600 mg total) by mouth every 8 (eight) hours as needed.   nirmatrelvir/ritonavir (PAXLOVID) 20 x 150 MG & 10 x 100MG  TABS Take 3 tablets by mouth 2 (two) times daily for 5 days. (Take nirmatrelvir 150 mg two tablets twice daily for 5 days and ritonavir 100 mg one tablet twice daily for 5 days) Patient GFR is 60   acetaminophen (TYLENOL) 500 MG tablet Take 500 mg by mouth every 6 (six) hours as needed for mild pain. (Patient not taking: Reported on 06/07/2023)   Multiple Vitamin (MULTI-DAY PO) Take 3 tablets by mouth daily. gummy (Patient not taking: Reported on 06/01/2023)   [DISCONTINUED] ibuprofen (ADVIL) 200 MG tablet Take 200 mg by mouth every 6 (six) hours as needed for mild pain. (Patient not taking: Reported on 06/07/2023)   No facility-administered encounter medications on file as of 06/07/2023.    Follow-up: Return in about 2 months (around 08/08/2023).   Donell Beers, FNP

## 2023-06-07 NOTE — Assessment & Plan Note (Addendum)
Imaging studies done at the hospital with negative for fractures Ibuprofen and Flexeril ordered today to be taken as needed. Stated that he is not ready to go back to work, work note provided with plans to go back to work next week Monday

## 2023-06-07 NOTE — Assessment & Plan Note (Signed)
Hospital discharge summary, labs and imaging studies reviewed by me today

## 2023-06-07 NOTE — Patient Instructions (Addendum)
1. Musculoskeletal pain You were given Toradol 30 mg injection in the office today - ibuprofen (ADVIL) 600 MG tablet; Take 1 tablet (600 mg total) by mouth every 8 (eight) hours as needed.  Dispense: 30 tablet; Refill: 0 - cyclobenzaprine (FLEXERIL) 5 MG tablet; Take 1 tablet (5 mg total) by mouth 3 (three) times daily as needed for muscle spasms.  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 COVID-19 virus infection  - nirmatrelvir/ritonavir (PAXLOVID) 20 x 150 MG & 10 x 100MG  TABS; Take 3 tablets by mouth 2 (two) times daily for 5 days. (Take nirmatrelvir 150 mg two tablets twice daily for 5 days and ritonavir 100 mg one tablet twice daily for 5 days) Patient GFR is 60  Dispense: 30 tablet; Refill: 0 - benzonatate (TESSALON) 100 MG capsule; Take 1 capsule (100 mg total) by mouth 3 (three) times daily as needed for cough.  Dispense: 20 capsule; Refill: 0   It is important that you exercise regularly at least 30 minutes 5 times a week as tolerated  Think about what you will eat, plan ahead. Choose " clean, green, fresh or frozen" over canned, processed or packaged foods which are more sugary, salty and fatty. 70 to 75% of food eaten should be vegetables and fruit. Three meals at set times with snacks allowed between meals, but they must be fruit or vegetables. Aim to eat over a 12 hour period , example 7 am to 7 pm, and STOP after  your last meal of the day. Drink water,generally about 64 ounces per day, no other drink is as healthy. Fruit juice is best enjoyed in a healthy way, by EATING the fruit.  Thanks for choosing [facility] we consider it a privelige to serve you.

## 2023-06-07 NOTE — Assessment & Plan Note (Signed)
Due to recent fall  - ibuprofen (ADVIL) 600 MG tablet; Take 1 tablet (600 mg total) by mouth every 8 (eight) hours as needed.  Dispense: 30 tablet; Refill: 0 - cyclobenzaprine (FLEXERIL) 5 MG tablet; Take 1 tablet (5 mg total) by mouth 3 (three) times daily as needed for muscle spasms.  Dispense: 30 tablet; Refill: 0 Toradol 30 mg IM injection given in the office today.  Patient told to alternate ibuprofen with Tylenol 650 mg every 6 hours as needed.  Side effects of Flexeril including drowsiness discussed

## 2023-06-07 NOTE — Assessment & Plan Note (Signed)
-   nirmatrelvir/ritonavir (PAXLOVID) 20 x 150 MG & 10 x 100MG  TABS; Take 3 tablets by mouth 2 (two) times daily for 5 days. (Take nirmatrelvir 150 mg two tablets twice daily for 5 days and ritonavir 100 mg one tablet twice daily for 5 days) Patient GFR is 60  Dispense: 30 tablet; Refill: 0 - benzonatate (TESSALON) 100 MG capsule; Take 1 capsule (100 mg total) by mouth 3 (three) times daily as needed for cough.  Dispense: 20 capsule; Refill: 0  Patient encouraged to remain in isolation for 5 days Importance of proper hand hygiene discussed

## 2024-10-20 ENCOUNTER — Emergency Department (HOSPITAL_COMMUNITY)
Admission: EM | Admit: 2024-10-20 | Discharge: 2024-10-20 | Disposition: A | Discharge status: Home / Self Care | Attending: Emergency Medicine | Admitting: Emergency Medicine

## 2024-10-20 DIAGNOSIS — M542 Cervicalgia: Secondary | ICD-10-CM | POA: Insufficient documentation

## 2024-10-20 DIAGNOSIS — J45909 Unspecified asthma, uncomplicated: Secondary | ICD-10-CM | POA: Insufficient documentation

## 2024-10-20 MED ORDER — TRAZODONE HCL 50 MG PO TABS: 50.0000 mg | ORAL_TABLET | Freq: Every day | ORAL | 0 refills | Status: AC

## 2024-10-20 MED ORDER — OXYCODONE HCL 5 MG PO TABS: 5.0000 mg | ORAL_TABLET | Freq: Four times a day (QID) | ORAL | 0 refills | Status: AC | PRN

## 2024-10-20 NOTE — ED Triage Notes (Signed)
 Patient in today reporting ongoing neck pain, all over. Reports multiple meds with no relief.

## 2024-10-20 NOTE — Discharge Instructions (Addendum)
 It was a pleasure taking care of you today.  Based on your history and physical exam I feel you are safe for discharge.  Please follow-up with your orthopedic provider regarding the possibility of more neck injections.  Due to your persistent pain I do recommend that you continue all of your outpatient therapy including the meloxicam, gabapentin , lidocaine  patches, as well as methocarbamol .  I have also sent in a prescription narcotic pain medication called Roxicodone , please do not drive or operate heavy machinery after taking this medication.  I have also sent in a refill for your trazodone .  Please take as prescribed.  Please do not combine the trazodone  and Roxicodone  medication.  If you experience any of the following symptoms including but not limited to unexplained weakness, issues walking, radiating numbness/tingling, severe pain, or other concerning symptom please return to the emergency department or seek further medical care.  Please make your primary care provider aware of your visit and all findings today, please follow-up with your primary care provider within the next 1 to 2 weeks. Please your primary care provider aware of your workup and all findings today.  Please try to follow-up with orthopedics within the next 1 to 2 weeks.
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
## 2024-11-05 ENCOUNTER — Encounter (HOSPITAL_COMMUNITY): Payer: Self-pay | Admitting: Internal Medicine

## 2024-11-05 ENCOUNTER — Ambulatory Visit (HOSPITAL_COMMUNITY)

## 2024-11-05 ENCOUNTER — Ambulatory Visit (HOSPITAL_COMMUNITY)
Admission: EM | Admit: 2024-11-05 | Discharge: 2024-11-05 | Disposition: A | Discharge status: Home / Self Care | Source: Ambulatory Visit | Attending: Internal Medicine | Admitting: Internal Medicine

## 2024-11-05 DIAGNOSIS — M25521 Pain in right elbow: Secondary | ICD-10-CM

## 2024-11-05 DIAGNOSIS — R058 Other specified cough: Secondary | ICD-10-CM

## 2024-11-05 DIAGNOSIS — R051 Acute cough: Secondary | ICD-10-CM | POA: Diagnosis not present

## 2024-11-05 DIAGNOSIS — M7711 Lateral epicondylitis, right elbow: Secondary | ICD-10-CM

## 2024-11-05 MED ORDER — DICLOFENAC SODIUM 1 % EX GEL: 2.0000 g | Freq: Four times a day (QID) | CUTANEOUS | 0 refills | Status: AC

## 2024-11-05 MED ORDER — HYDROCODONE BIT-HOMATROP MBR 5-1.5 MG/5ML PO SOLN: 5.0000 mL | Freq: Four times a day (QID) | ORAL | 0 refills | Status: AC | PRN

## 2024-11-05 MED ORDER — PREDNISONE 10 MG (21) PO TBPK: ORAL_TABLET | Freq: Every day | ORAL | 0 refills | Status: AC

## 2024-11-05 NOTE — Discharge Instructions (Addendum)
 X-ray of the right elbow done today.  Final evaluation with the radiologist is still pending.  On brief evaluation there does not appear to be any acute findings.  Once the radiologist has finalized the imaging and report we will contact you if there is any changes needed in the treatment plan.  Symptoms and physical exam findings are most consistent with a lateral epicondylitis of the right elbow.  This is an inflammation surrounding the outside aspect of the elbow. We can treat with the following and if no improvement then we will need to consider following up with an orthopedist: Prednisone taper, 60mg  (6 pills) on day 1 and day 2, 50mg  (5 pills) on day 3 and 4, 40mg  (4 pills) on day 5 and 6, 30mg  (3 pills) on day 7 and 8 and 20mg  (2 pill) on day 9 and day 10, 10mg  (1 pill) on day 11 and day 12.  This is a steroid.  Take this in the morning. Wear elbow brace when you are doing any activity especially lifting or repetitive activities. Apply diclofenac  gel 3-4 times daily to the lateral elbow Ice the area 2-3 times daily for 10-15 minutes to help with pain and swelling. Do not apply ice directly to the skin.    Chest x-ray done today.  Final evaluation by the radiologist is still pending but on brief evaluation there does not appear to be any acute findings.  Symptoms and physical exam findings are most consistent with a postviral cough syndrome.  We can treat this with medication for cough as well as the steroids as above. Hycodan 5 mL every 6 hours as needed for cough.  Use caution as this medication can cause drowsiness.  Do not recommend driving while you are taking this medication.

## 2024-11-05 NOTE — ED Triage Notes (Signed)
 Pt c/o right elbow pain for 2 weeks. Denies any new injury. Tried ice and OTC medications and oxycodone .   Pt also reports having a cough for couple weeks after getting over flu symptoms.
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
